# Patient Record
Sex: Male | Born: 1975 | Race: White | Hispanic: No | Marital: Married | Smoking: Never smoker
Health system: Southern US, Community
[De-identification: ages and names within clinical notes are randomized; demographics above are authoritative.]

---

## 2010-04-10 ENCOUNTER — Encounter (INDEPENDENT_AMBULATORY_CARE_PROVIDER_SITE_OTHER): Payer: Self-pay | Admitting: *Deleted

## 2010-04-10 LAB — CONVERTED CEMR LAB: Testosterone: 438.09 ng/dL (ref 250–890)

## 2010-09-04 ENCOUNTER — Encounter: Payer: Self-pay | Admitting: Cardiovascular Disease

## 2010-09-04 ENCOUNTER — Ambulatory Visit (INDEPENDENT_AMBULATORY_CARE_PROVIDER_SITE_OTHER): Payer: Self-pay | Admitting: Cardiovascular Disease

## 2010-09-04 DIAGNOSIS — I499 Cardiac arrhythmia, unspecified: Secondary | ICD-10-CM

## 2010-09-04 DIAGNOSIS — R079 Chest pain, unspecified: Secondary | ICD-10-CM | POA: Insufficient documentation

## 2010-09-04 NOTE — Assessment & Plan Note (Signed)
Non cardiac chest pain.  Does not need ETT.  F/U [facility] and consider MRI of left shoulder

## 2010-09-04 NOTE — Progress Notes (Signed)
35 yo from [place] referred by [facility] for SSCP.  Pain totally atypical over last few months.  Involves left shoulder mostly.  Pain is positional and sharp with migration over shoulder.  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  No significant CRF;s.  No associated dyspnea, palpitations, edema or syncope.  Pain clearly made worse with left arm movement.  ECG is normal  Reassured patient that pain was not cadiac.  Can F/U with [facility] and does not need stress testing at this time  ROS: Denies fever, malais, weight loss, blurry vision, decreased visual acuity, cough, sputum, SOB, hemoptysis, pleuritic pain, palpitaitons, heartburn, abdominal pain, melena, lower extremity edema, claudication, or rash.  All other systems reviewed and negative   General: Affect appropriate Healthy:  appears stated age HEENT: normal Neck supple with no adenopathy JVP normal no bruits no thyromegaly Lungs clear with no wheezing and good diaphragmatic motion Heart:  S1/S2 no murmur,rub, gallop or click PMI normal Abdomen: benighn, BS positve, no tenderness, no AAA no bruit.  No HSM or HJR Distal pulses intact with no bruits No edema Neuro non-focal Skin warm and dry No muscular weakness  Medications No current outpatient prescriptions on file.    Allergies Review of patient's allergies indicates no known allergies.  Family History: No family history on file.  Social History: History   Social History  . Marital Status: Single    Spouse Name: N/A    Number of Children: N/A  . Years of Education: N/A   Occupational History  . Not on file.   Social History Main Topics  . Smoking status: Never Smoker   . Smokeless tobacco: Never Used  . Alcohol Use: No  . Drug Use: No  . Sexually Active: Not on file   Other Topics Concern  . Not on file   Social History Narrative  . No narrative on file    Electrocardiogram:  NSR 76 normal ECG  Assessment and Plan

## 2011-02-17 ENCOUNTER — Other Ambulatory Visit: Payer: Self-pay | Admitting: Family Medicine

## 2011-02-17 ENCOUNTER — Ambulatory Visit (HOSPITAL_COMMUNITY)
Admission: RE | Admit: 2011-02-17 | Discharge: 2011-02-17 | Disposition: A | Discharge status: Home / Self Care | Payer: Self-pay | Source: Ambulatory Visit | Attending: Family Medicine | Admitting: Family Medicine

## 2011-02-17 DIAGNOSIS — M25512 Pain in left shoulder: Secondary | ICD-10-CM

## 2011-02-17 DIAGNOSIS — M25519 Pain in unspecified shoulder: Secondary | ICD-10-CM | POA: Insufficient documentation

## 2011-03-02 ENCOUNTER — Ambulatory Visit: Payer: Self-pay | Admitting: Physical Therapy

## 2011-03-04 ENCOUNTER — Ambulatory Visit: Payer: Self-pay | Attending: Family Medicine | Admitting: Physical Therapy

## 2011-03-04 DIAGNOSIS — M542 Cervicalgia: Secondary | ICD-10-CM | POA: Insufficient documentation

## 2011-03-04 DIAGNOSIS — M546 Pain in thoracic spine: Secondary | ICD-10-CM | POA: Insufficient documentation

## 2011-03-04 DIAGNOSIS — IMO0001 Reserved for inherently not codable concepts without codable children: Secondary | ICD-10-CM | POA: Insufficient documentation

## 2011-03-04 DIAGNOSIS — M2569 Stiffness of other specified joint, not elsewhere classified: Secondary | ICD-10-CM | POA: Insufficient documentation

## 2011-03-10 ENCOUNTER — Ambulatory Visit: Payer: Self-pay | Admitting: Physical Therapy

## 2011-03-13 ENCOUNTER — Ambulatory Visit: Payer: Self-pay | Admitting: Physical Therapy

## 2011-03-20 ENCOUNTER — Ambulatory Visit: Payer: Self-pay | Admitting: Physical Therapy

## 2012-11-11 ENCOUNTER — Ambulatory Visit (INDEPENDENT_AMBULATORY_CARE_PROVIDER_SITE_OTHER): Payer: BC Managed Care – PPO | Admitting: Family Medicine

## 2012-11-11 VITALS — BP 122/74 | HR 85 | Temp 98.0°F | Resp 17 | Ht 64.5 in | Wt 142.0 lb

## 2012-11-11 DIAGNOSIS — B356 Tinea cruris: Secondary | ICD-10-CM

## 2012-11-11 DIAGNOSIS — B353 Tinea pedis: Secondary | ICD-10-CM

## 2012-11-11 DIAGNOSIS — B354 Tinea corporis: Secondary | ICD-10-CM

## 2012-11-11 LAB — POCT CBC
Granulocyte percent: 47.5 %G (ref 37–80)
HCT, POC: 47.1 % (ref 43.5–53.7)
Hemoglobin: 15.6 g/dL (ref 14.1–18.1)
MCV: 93.4 fL (ref 80–97)
POC Granulocyte: 2.8 (ref 2–6.9)
POC LYMPH PERCENT: 45.6 %L (ref 10–50)
RBC: 5.04 M/uL (ref 4.69–6.13)
RDW, POC: 13.6 %

## 2012-11-11 MED ORDER — TERBINAFINE HCL 250 MG PO TABS: 250.0000 mg | ORAL_TABLET | Freq: Every day | ORAL | Status: DC

## 2012-11-11 NOTE — Progress Notes (Signed)
[facility] [address], [place] [address] [phone]  Date:  11/11/2012   Name:  Steven Campos   DOB:  June 15, 1975   MRN:  [id number]  PCP:  No PCP Per Patient    Chief Complaint: Neck Pain, Sore Throat and Groin Pain   History of Present Illness:  Steven Campos is a 37 y.o. very pleasant male patient who presents with the following:  Here today as a new patient.  Seen with non- cardiac CP by DR. Nishan in the past and cleared.  He is from the [place]  He states that "I have many things, I am here to be checked because I feel some pain."  Over the last 8 months he has had about 3 episodes when he woke up with his arm asleep and tingling.  This resolves when he moves the limb and shakes it.   He also notes something in his neck- it seems to make him sweat and sometimes feels itchy.  He notes a mild rash He also notes itching and hyperpigmentation in his groin area.  He also has a scaly rash on both feet.    He is generally healhty, no chronic health problems per his knowledge Patient Active Problem List   Diagnosis Date Noted  . Chest pain 09/04/2010    Past Medical History  Diagnosis Date  . Chest pain     with left shoulder pain    History reviewed. No pertinent past surgical history.  History  Substance Use Topics  . Smoking status: Never Smoker   . Smokeless tobacco: Never Used  . Alcohol Use: No    History reviewed. No pertinent family history.  No Known Allergies  Medication list has been reviewed and updated.  No current outpatient prescriptions on file prior to visit.   No current facility-administered medications on file prior to visit.    Review of Systems:  As per HPI- otherwise negative.   Physical Examination: Filed Vitals:   11/11/12 1230  BP: 122/74  Pulse: 85  Temp: 98 F (36.7 C)  Resp: 17   Filed Vitals:   11/11/12 1230  Height: 5' 4.5" (1.638 m)  Weight: 142 lb (64.411 kg)   Body mass index is  24.01 kg/(m^2). Ideal Body Weight: Weight in (lb) to have BMI = 25: 147.6  GEN: WDWN, NAD, Non-toxic, A & O x 3 HEENT: Atraumatic, Normocephalic. Neck supple. No masses, No LAD.  Bilateral TM wnl, oropharynx normal.  PEERL,EOMI.   Ears and Nose: No external deformity. CV: RRR, No M/G/R. No JVD. No thrill. No extra heart sounds. PULM: CTA B, no wheezes, crackles, rhonchi. No retractions. No resp. distress. No accessory muscle use. ABD: S, NT, ND, +BS. No rebound. No HSM. EXTR: No c/c/e NEURO Normal gait. Normal ROM, strength, sensation and DTR of both UE PSYCH: Normally interactive. Conversant. Not depressed or anxious appearing.  Calm demeanor.  Tinea corporis on his back, tinea pedis bilaterally, tinea cruris   Results for orders placed in visit on 11/11/12  POCT CBC      Result Value Range   WBC 5.8  4.6 - 10.2 K/uL   Lymph, poc 2.6  0.6 - 3.4   POC LYMPH PERCENT 45.6  10 - 50 %L   MID (cbc) 0.4  0 - 0.9   POC MID % 6.9  0 - 12 %M   POC Granulocyte 2.8  2 - 6.9   Granulocyte percent 47.5  37 - 80 %  G   RBC 5.04  4.69 - 6.13 M/uL   Hemoglobin 15.6  14.1 - 18.1 g/dL   HCT, POC 16.1  09.6 - 53.7 %   MCV 93.4  80 - 97 fL   MCH, POC 31.0  27 - 31.2 pg   MCHC 33.1  31.8 - 35.4 g/dL   RDW, POC 04.5     Platelet Count, POC 203  142 - 424 K/uL   MPV 9.3  0 - 99.8 fL    Assessment and Plan: Tinea corporis - Plan: terbinafine (LAMISIL) 250 MG tablet, POCT CBC, Comprehensive metabolic panel  Tinea pedis - Plan: terbinafine (LAMISIL) 250 MG tablet  Tinea cruris - Plan: terbinafine (LAMISIL) 250 MG tablet  Widespread tinea rash.  Treat with oral lamisil for 2 weeks.  Reassured that it sounds like he has slept on his arm a few times and it went to sleep.  This is not dangerous.  Await labs   Signed Abbe Amsterdam, MD

## 2012-11-11 NOTE — Patient Instructions (Signed)
Use the medication for your rash/ itching.  One pill a day for 2 weeks.  I will be in touch with your labs when they come in.

## 2012-11-12 ENCOUNTER — Encounter: Payer: Self-pay | Admitting: Family Medicine

## 2012-11-12 LAB — COMPREHENSIVE METABOLIC PANEL
Albumin: 4.5 g/dL (ref 3.5–5.2)
Alkaline Phosphatase: 52 U/L (ref 39–117)
BUN: 12 mg/dL (ref 6–23)
CO2: 27 mEq/L (ref 19–32)
Calcium: 9.5 mg/dL (ref 8.4–10.5)
Chloride: 104 mEq/L (ref 96–112)
Glucose, Bld: 104 mg/dL — ABNORMAL HIGH (ref 70–99)
Potassium: 4.1 mEq/L (ref 3.5–5.3)
Sodium: 139 mEq/L (ref 135–145)
Total Protein: 7.3 g/dL (ref 6.0–8.3)

## 2013-06-29 ENCOUNTER — Ambulatory Visit (INDEPENDENT_AMBULATORY_CARE_PROVIDER_SITE_OTHER): Payer: BC Managed Care – PPO | Admitting: Family Medicine

## 2013-06-29 VITALS — BP 104/60 | HR 62 | Temp 97.8°F | Resp 16 | Ht 65.0 in | Wt 142.2 lb

## 2013-06-29 DIAGNOSIS — R109 Unspecified abdominal pain: Secondary | ICD-10-CM

## 2013-06-29 DIAGNOSIS — B356 Tinea cruris: Secondary | ICD-10-CM

## 2013-06-29 DIAGNOSIS — B354 Tinea corporis: Secondary | ICD-10-CM

## 2013-06-29 DIAGNOSIS — B353 Tinea pedis: Secondary | ICD-10-CM

## 2013-06-29 DIAGNOSIS — M25519 Pain in unspecified shoulder: Secondary | ICD-10-CM

## 2013-06-29 DIAGNOSIS — M25511 Pain in right shoulder: Secondary | ICD-10-CM

## 2013-06-29 MED ORDER — DICLOFENAC SODIUM 75 MG PO TBEC: 75.0000 mg | DELAYED_RELEASE_TABLET | Freq: Two times a day (BID) | ORAL | Status: DC

## 2013-06-29 MED ORDER — TERBINAFINE HCL 250 MG PO TABS: 250.0000 mg | ORAL_TABLET | Freq: Every day | ORAL | Status: DC

## 2013-06-29 NOTE — Patient Instructions (Signed)
Take the Lamisil (terbinafine) one daily for 2 weeks  Use some over-the-counter Lotrimin cream also twice daily on the rash in the groin and on the back of the neck  Take diclofenac one at breakfast and one at supper for 2 week for the pain and inflammation in the right shoulder and in both sides of the abdomen. I think that the abdominal pain is probably just in the muscles from the constant strain doing case picking.  The pain in the right shoulder sounds like you are pinching I nerve in the neck that is shooting the severe pain. The anti-inflammatory medication should help. If the pain keeps recurring we may have to do some special x-ray studies.  The place on your right leg probably will just go away on its own. If he gets further problems get rechecked.

## 2013-06-29 NOTE — Progress Notes (Signed)
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 He was here last summer with a rash on the back of his neck and in his groin area. He was treated with Lamisil. The rash in the groin when away completely. Neck did better for a little while then recurred. The rash has now come back in the groin area again. He also has been having a pain in the right shoulder area, just above the right collarbone. It feels deep. He can twist his neck to the left to get some relief. He if he gives deep massaging it helps a little bit. When it hits him it is very intense pain. It doesn't last for long but it is bad about hurts. He also has problems with both sides of the abdominal wall having pain. No problems with his bowels or urine. He is tender there. Lastly he has a little bump on his right thigh. He's been hurting there, and then he noticed this little bump.  Objective: Pleasant gentleman in no major distress. The skin of the neck is a little rough but no obvious rash. He has a obvious tinea cruris rash in the groin area. On the right side there is a little nonspecific 2 or 3 mm bump, almost looks like a little folliculitis that is getting better. The neck has fair range of motion. He is tender in the right supraclavicular area. No nodes. Good range of motion. Abdomen has normal bowel sounds. Soft without masses. He is tender for laterally in both lateral flank areas.  Assessment: Tinea cruris and probable tinea corporis Right shoulder pains, probably neuropathic from a nerve being pinched in the neck area. Nonspecific flank pain, probable muscle strain Nonspecific skin lesion right thigh  Plan: Treat with Lamisil one more time. In addition to that give him instructions to use some common cream Diclofenac one twice daily for 2 weeks for the supraclavicular and abdominal wall pain If the place on his thigh gets worse he is to return

## 2013-11-13 ENCOUNTER — Ambulatory Visit (INDEPENDENT_AMBULATORY_CARE_PROVIDER_SITE_OTHER): Payer: BC Managed Care – PPO | Admitting: Internal Medicine

## 2013-11-13 ENCOUNTER — Ambulatory Visit (INDEPENDENT_AMBULATORY_CARE_PROVIDER_SITE_OTHER): Payer: BC Managed Care – PPO

## 2013-11-13 VITALS — BP 120/66 | HR 61 | Temp 97.6°F | Resp 18 | Ht 64.0 in | Wt 139.0 lb

## 2013-11-13 DIAGNOSIS — R1032 Left lower quadrant pain: Secondary | ICD-10-CM

## 2013-11-13 DIAGNOSIS — R079 Chest pain, unspecified: Secondary | ICD-10-CM

## 2013-11-13 LAB — POCT URINALYSIS DIPSTICK
Bilirubin, UA: NEGATIVE
Blood, UA: NEGATIVE
Glucose, UA: NEGATIVE
Ketones, UA: NEGATIVE
Leukocytes, UA: NEGATIVE
Nitrite, UA: NEGATIVE
Protein, UA: NEGATIVE
Spec Grav, UA: 1.015
Urobilinogen, UA: 0.2
pH, UA: 7

## 2013-11-13 LAB — POCT CBC
Granulocyte percent: 39.6 % (ref 37–80)
HCT, POC: 49.8 % (ref 43.5–53.7)
Hemoglobin: 16.2 g/dL (ref 14.1–18.1)
Lymph, poc: 3 (ref 0.6–3.4)
MCH, POC: 29.6 pg (ref 27–31.2)
MCHC: 32.5 g/dL (ref 31.8–35.4)
MCV: 91 fL (ref 80–97)
MID (cbc): 0.4 (ref 0–0.9)
MPV: 7.7 fL (ref 0–99.8)
POC Granulocyte: 2.2 (ref 2–6.9)
POC LYMPH PERCENT: 52.8 % — AB (ref 10–50)
POC MID %: 7.6 % (ref 0–12)
Platelet Count, POC: 193 K/uL (ref 142–424)
RBC: 5.47 M/uL (ref 4.69–6.13)
RDW, POC: 13.1 %
WBC: 5.6 K/uL (ref 4.6–10.2)

## 2013-11-13 LAB — POCT UA - MICROSCOPIC ONLY
Bacteria, U Microscopic: NEGATIVE
Casts, Ur, LPF, POC: NEGATIVE
Crystals, Ur, HPF, POC: NEGATIVE
Mucus, UA: NEGATIVE
RBC, urine, microscopic: NEGATIVE
WBC, Ur, HPF, POC: NEGATIVE
Yeast, UA: NEGATIVE

## 2013-11-13 LAB — POCT SEDIMENTATION RATE: POCT SED RATE: 15 mm/h (ref 0–22)

## 2013-11-13 MED ORDER — POLYETHYLENE GLYCOL 3350 17 GM/SCOOP PO POWD: 17.0000 g | Freq: Every day | ORAL | Status: DC

## 2013-11-13 NOTE — Progress Notes (Signed)
Subjective:    Patient ID: Steven Campos, male    DOB: [birth year], 38 y.o.   MRN: [id number]  Flank Pain Pertinent negatives include no dysuria or fever.  Shoulder Pain  Pertinent negatives include no fever.   this 38 year old male has 2 complaints He has noticed right-sided chest pain on and off for several months This occurs while sitting still or while at work or even at night when he rolls over or tries to sit up  There is no associated shortness of breath, palpitations, syncope, change in activity level. This has not caused him to miss work or stop a current activity He also is complaining of abdominal pain now in the left lower quadrant He was evaluated here in April with pain in the abdomen and has no acute findings He has intermittent pain down the left lower quadrant, the right lower quadrant has resolved. He denies constipation diarrhea change in appetite weight loss fever or night sweats. He denies any urinary symptoms   He has no current illnesses and is on no medications   Review of Systems  Constitutional: Negative for fever, activity change, appetite change, fatigue and unexpected weight change.  HENT: Negative for trouble swallowing.   Respiratory: Negative for cough, chest tightness, shortness of breath and wheezing.   Cardiovascular: Negative for palpitations and leg swelling.  Gastrointestinal: Negative for nausea, vomiting, diarrhea, constipation and blood in stool.  Genitourinary: Negative for dysuria, frequency, flank pain and difficulty urinating.  Musculoskeletal: Negative for back pain.       Objective:   Physical Exam BP 120/66  Pulse 61  Temp(Src) 97.6 F (36.4 C) (Oral)  Resp 18  Ht 5\' 4"  (1.626 m)  Wt 139 lb (63.05 kg)  BMI 23.85 kg/m2  SpO2 98% No acute distress HEENT clear////neck full range of motion without pain Heart regular without murmur The chest is clear to auscultation There is no tenderness in the area of pain mentioned with  palpation of the clavicle, the anterior, the costosternal junctions, or the sternum Shoulder on the right has a full range of motion without pain with resisted and unresisted movements The abdomen is soft nontender nondistended with no masses or organomegaly Extremities have no edema   [facility] reading (PRIMARY) by  Dr. Merla Riches chest x-ray shows no acute findings or chronic problems. The abdomen has lots of gas trapped sparing the distal colon which is likely full stool  Results for orders placed in visit on 11/13/13  POCT CBC      Result Value Ref Range   WBC 5.6  4.6 - 10.2 K/uL   Lymph, poc 3.0  0.6 - 3.4   POC LYMPH PERCENT 52.8 (*) 10 - 50 %L   MID (cbc) 0.4  0 - 0.9   POC MID % 7.6  0 - 12 %M   POC Granulocyte 2.2  2 - 6.9   Granulocyte percent 39.6  37 - 80 %G   RBC 5.47  4.69 - 6.13 M/uL   Hemoglobin 16.2  14.1 - 18.1 g/dL   HCT, POC 40.9  81.1 - 53.7 %   MCV 91.0  80 - 97 fL   MCH, POC 29.6  27 - 31.2 pg   MCHC 32.5  31.8 - 35.4 g/dL   RDW, POC 91.4     Platelet Count, POC 193  142 - 424 K/uL   MPV 7.7  0 - 99.8 fL  POCT UA - MICROSCOPIC ONLY      Result Value  Ref Range   WBC, Ur, HPF, POC neg     RBC, urine, microscopic neg     Bacteria, U Microscopic neg     Mucus, UA neg     Epithelial cells, urine per micros 0-1     Crystals, Ur, HPF, POC neg     Casts, Ur, LPF, POC neg     Yeast, UA neg    POCT URINALYSIS DIPSTICK      Result Value Ref Range   Color, UA yellow     Clarity, UA clear     Glucose, UA neg     Bilirubin, UA neg     Ketones, UA neg     Spec Grav, UA 1.015     Blood, UA neg     pH, UA 7.0     Protein, UA neg     Urobilinogen, UA 0.2     Nitrite, UA neg     Leukocytes, UA Negative           Assessment & Plan:  Chest pain, unspecified - Plan: POCT CBC, DG Chest 2 View  Abdominal pain, LLQ - Plan: POCT CBC, POCT SEDIMENTATION RATE, POCT UA - Microscopic Only, POCT urinalysis dipstick, Comprehensive metabolic panel, DG Abd 1 View  Meds  ordered this encounter  Medications  . polyethylene glycol powder (GLYCOLAX/MIRALAX) powder    Sig: Take 17 g by mouth daily. For 10 days    Dispense:  255 g    Refill:  1   Reassured If meds do not work we'll do a barium enema or have GI evaluate

## 2013-11-13 NOTE — Patient Instructions (Signed)
Constipation  Constipation is when a person has fewer than three bowel movements a week, has difficulty having a bowel movement, or has stools that are dry, hard, or larger than normal. As people grow older, constipation is more common. If you try to fix constipation with medicines that make you have a bowel movement (laxatives), the problem may get worse. Long-term laxative use may cause the muscles of the colon to become weak. A low-fiber diet, not taking in enough fluids, and taking certain medicines may make constipation worse.   CAUSES   · Certain medicines, such as antidepressants, pain medicine, iron supplements, antacids, and water pills.    · Certain diseases, such as diabetes, irritable bowel syndrome (IBS), thyroid disease, or depression.    · Not drinking enough water.    · Not eating enough fiber-rich foods.    · Stress or travel.    · Lack of physical activity or exercise.    · Ignoring the urge to have a bowel movement.    · Using laxatives too much.    SIGNS AND SYMPTOMS   · Having fewer than three bowel movements a week.    · Straining to have a bowel movement.    · Having stools that are hard, dry, or larger than normal.    · Feeling full or bloated.    · Pain in the lower abdomen.    · Not feeling relief after having a bowel movement.    DIAGNOSIS   Your health care provider will take a medical history and perform a physical exam. Further testing may be done for severe constipation. Some tests may include:  · A barium enema X-ray to examine your rectum, colon, and, sometimes, your small intestine.    · A sigmoidoscopy to examine your lower colon.    · A colonoscopy to examine your entire colon.  TREATMENT   Treatment will depend on the severity of your constipation and what is causing it. Some dietary treatments include drinking more fluids and eating more fiber-rich foods. Lifestyle treatments may include regular exercise. If these diet and lifestyle recommendations do not help, your health care  provider may recommend taking over-the-counter laxative medicines to help you have bowel movements. Prescription medicines may be prescribed if over-the-counter medicines do not work.   HOME CARE INSTRUCTIONS   · Eat foods that have a lot of fiber, such as fruits, vegetables, whole grains, and beans.  · Limit foods high in fat and processed sugars, such as french fries, hamburgers, cookies, candies, and soda.    · A fiber supplement may be added to your diet if you cannot get enough fiber from foods.    · Drink enough fluids to keep your urine clear or pale yellow.    · Exercise regularly or as directed by your health care provider.    · Go to the restroom when you have the urge to go. Do not hold it.    · Only take over-the-counter or prescription medicines as directed by your health care provider. Do not take other medicines for constipation without talking to your health care provider first.    SEEK IMMEDIATE MEDICAL CARE IF:   · You have bright red blood in your stool.    · Your constipation lasts for more than 4 days or gets worse.    · You have abdominal or rectal pain.    · You have thin, pencil-like stools.    · You have unexplained weight loss.  MAKE SURE YOU:   · Understand these instructions.  · Will watch your condition.  · Will get help right away if you are not   you have with your health care provider. Costochondritis Costochondritis, sometimes called Tietze syndrome, is a swelling and irritation (inflammation) of the tissue (cartilage) that connects your ribs with your breastbone (sternum). It causes pain in the chest and rib area. Costochondritis usually goes away on its own over time. It can take up to 6  weeks or longer to get better, especially if you are unable to limit your activities. CAUSES  Some cases of costochondritis have no known cause. Possible causes include:  Injury (trauma).  Exercise or activity such as lifting.  Severe coughing. SIGNS AND SYMPTOMS  Pain and tenderness in the chest and rib area.  Pain that gets worse when coughing or taking deep breaths.  Pain that gets worse with specific movements. DIAGNOSIS  Your health care provider will do a physical exam and ask about your symptoms. Chest X-rays or other tests may be done to rule out other problems. TREATMENT  Costochondritis usually goes away on its own over time. Your health care provider may prescribe medicine to help relieve pain. HOME CARE INSTRUCTIONS   Avoid exhausting physical activity. Try not to strain your ribs during normal activity. This would include any activities using chest, abdominal, and side muscles, especially if heavy weights are used.  Apply ice to the affected area for the first 2 days after the pain begins.  Put ice in a plastic bag.  Place a towel between your skin and the bag.  Leave the ice on for 20 minutes, 2-3 times a day.  Only take over-the-counter or prescription medicines as directed by your health care provider. SEEK MEDICAL CARE IF:  You have redness or swelling at the rib joints. These are signs of infection.  Your pain does not go away despite rest or medicine. SEEK IMMEDIATE MEDICAL CARE IF:   Your pain increases or you are very uncomfortable.  You have shortness of breath or difficulty breathing.  You cough up blood.  You have worse chest pains, sweating, or vomiting.  You have a fever or persistent symptoms for more than 2-3 days.  You have a fever and your symptoms suddenly get worse. MAKE SURE YOU:   Understand these instructions.  Will watch your condition.  Will get help right away if you are not doing well or get worse. Document Released:  12/24/2004 Document Revised: 01/04/2013 Document Reviewed: 10/18/2012 [facility]ExitCare Patient Information 2015 [employer] [place]. This information is not intended to replace advice given to you by your health care provider. Make sure you discuss any questions you have with your health care provider.

## 2013-11-14 LAB — COMPREHENSIVE METABOLIC PANEL
ALBUMIN: 5 g/dL (ref 3.5–5.2)
ALT: 19 U/L (ref 0–53)
AST: 17 U/L (ref 0–37)
Alkaline Phosphatase: 58 U/L (ref 39–117)
BILIRUBIN TOTAL: 0.7 mg/dL (ref 0.2–1.2)
BUN: 7 mg/dL (ref 6–23)
CO2: 27 mEq/L (ref 19–32)
Calcium: 10.2 mg/dL (ref 8.4–10.5)
Chloride: 102 mEq/L (ref 96–112)
Creat: 0.67 mg/dL (ref 0.50–1.35)
GLUCOSE: 94 mg/dL (ref 70–99)
Potassium: 4.5 mEq/L (ref 3.5–5.3)
Sodium: 139 mEq/L (ref 135–145)
Total Protein: 7.6 g/dL (ref 6.0–8.3)

## 2013-11-17 ENCOUNTER — Encounter: Payer: Self-pay | Admitting: Internal Medicine

## 2013-12-19 ENCOUNTER — Ambulatory Visit (INDEPENDENT_AMBULATORY_CARE_PROVIDER_SITE_OTHER): Payer: BC Managed Care – PPO | Admitting: Internal Medicine

## 2013-12-19 ENCOUNTER — Ambulatory Visit (INDEPENDENT_AMBULATORY_CARE_PROVIDER_SITE_OTHER): Payer: BC Managed Care – PPO

## 2013-12-19 VITALS — BP 112/60 | HR 74 | Temp 98.1°F | Resp 16 | Ht 64.25 in | Wt 143.4 lb

## 2013-12-19 DIAGNOSIS — Z789 Other specified health status: Secondary | ICD-10-CM

## 2013-12-19 DIAGNOSIS — M545 Low back pain, unspecified: Secondary | ICD-10-CM

## 2013-12-19 DIAGNOSIS — R072 Precordial pain: Secondary | ICD-10-CM

## 2013-12-19 DIAGNOSIS — R0789 Other chest pain: Secondary | ICD-10-CM

## 2013-12-19 DIAGNOSIS — S20219A Contusion of unspecified front wall of thorax, initial encounter: Secondary | ICD-10-CM

## 2013-12-19 MED ORDER — METHOCARBAMOL 750 MG PO TABS: 750.0000 mg | ORAL_TABLET | Freq: Four times a day (QID) | ORAL | Status: DC

## 2013-12-19 MED ORDER — IBUPROFEN 600 MG PO TABS: 600.0000 mg | ORAL_TABLET | Freq: Three times a day (TID) | ORAL | Status: DC | PRN
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
## 2013-12-19 NOTE — Patient Instructions (Signed)

## 2014-06-14 ENCOUNTER — Ambulatory Visit (INDEPENDENT_AMBULATORY_CARE_PROVIDER_SITE_OTHER): Payer: 59 | Admitting: Family Medicine

## 2014-06-14 ENCOUNTER — Ambulatory Visit (INDEPENDENT_AMBULATORY_CARE_PROVIDER_SITE_OTHER): Payer: 59

## 2014-06-14 VITALS — BP 98/64 | HR 69 | Temp 98.3°F | Resp 16 | Ht 65.0 in | Wt 141.0 lb

## 2014-06-14 DIAGNOSIS — M609 Myositis, unspecified: Secondary | ICD-10-CM | POA: Diagnosis not present

## 2014-06-14 DIAGNOSIS — M25552 Pain in left hip: Secondary | ICD-10-CM | POA: Diagnosis not present

## 2014-06-14 DIAGNOSIS — M791 Myalgia: Secondary | ICD-10-CM

## 2014-06-14 DIAGNOSIS — E860 Dehydration: Secondary | ICD-10-CM | POA: Diagnosis not present

## 2014-06-14 DIAGNOSIS — B349 Viral infection, unspecified: Secondary | ICD-10-CM | POA: Diagnosis not present

## 2014-06-14 DIAGNOSIS — R55 Syncope and collapse: Secondary | ICD-10-CM | POA: Diagnosis not present

## 2014-06-14 DIAGNOSIS — IMO0001 Reserved for inherently not codable concepts without codable children: Secondary | ICD-10-CM

## 2014-06-14 DIAGNOSIS — R509 Fever, unspecified: Secondary | ICD-10-CM

## 2014-06-14 DIAGNOSIS — J111 Influenza due to unidentified influenza virus with other respiratory manifestations: Secondary | ICD-10-CM | POA: Diagnosis not present

## 2014-06-14 LAB — POCT CBC
Granulocyte percent: 57.2 %G (ref 37–80)
HEMATOCRIT: 48.2 % (ref 43.5–53.7)
Hemoglobin: 15.4 g/dL (ref 14.1–18.1)
LYMPH, POC: 1.9 (ref 0.6–3.4)
MCH, POC: 29.2 pg (ref 27–31.2)
MCHC: 32 g/dL (ref 31.8–35.4)
MCV: 91.2 fL (ref 80–97)
MID (cbc): 0.4 (ref 0–0.9)
MPV: 8.2 fL (ref 0–99.8)
POC Granulocyte: 3.1 (ref 2–6.9)
POC LYMPH %: 35.2 % (ref 10–50)
POC MID %: 7.6 % (ref 0–12)
Platelet Count, POC: 147 10*3/uL (ref 142–424)
RBC: 5.29 M/uL (ref 4.69–6.13)
RDW, POC: 13 %
WBC: 5.5 10*3/uL (ref 4.6–10.2)

## 2014-06-14 LAB — COMPREHENSIVE METABOLIC PANEL
ALBUMIN: 4.7 g/dL (ref 3.5–5.2)
ALK PHOS: 53 U/L (ref 39–117)
ALT: 23 U/L (ref 0–53)
AST: 22 U/L (ref 0–37)
BILIRUBIN TOTAL: 0.8 mg/dL (ref 0.2–1.2)
BUN: 9 mg/dL (ref 6–23)
CO2: 24 mEq/L (ref 19–32)
CREATININE: 0.78 mg/dL (ref 0.50–1.35)
Calcium: 9.6 mg/dL (ref 8.4–10.5)
Chloride: 99 mEq/L (ref 96–112)
GLUCOSE: 80 mg/dL (ref 70–99)
Potassium: 4.4 mEq/L (ref 3.5–5.3)
Sodium: 137 mEq/L (ref 135–145)
TOTAL PROTEIN: 7.4 g/dL (ref 6.0–8.3)

## 2014-06-14 LAB — POCT URINALYSIS DIPSTICK
BILIRUBIN UA: NEGATIVE
Blood, UA: NEGATIVE
Glucose, UA: NEGATIVE
Ketones, UA: NEGATIVE
Leukocytes, UA: NEGATIVE
NITRITE UA: NEGATIVE
Protein, UA: NEGATIVE
Urobilinogen, UA: 0.2
pH, UA: 6

## 2014-06-14 LAB — POCT UA - MICROSCOPIC ONLY
BACTERIA, U MICROSCOPIC: NEGATIVE
Casts, Ur, LPF, POC: NEGATIVE
Crystals, Ur, HPF, POC: NEGATIVE
Mucus, UA: NEGATIVE
RBC, URINE, MICROSCOPIC: NEGATIVE
YEAST UA: NEGATIVE

## 2014-06-14 LAB — CK: Total CK: 166 U/L (ref 7–232)

## 2014-06-14 LAB — POCT SEDIMENTATION RATE: POCT SED RATE: 25 mm/h — AB (ref 0–22)

## 2014-06-14 MED ORDER — BENZONATATE 100 MG PO CAPS: 100.0000 mg | ORAL_CAPSULE | Freq: Three times a day (TID) | ORAL | Status: DC | PRN

## 2014-06-14 MED ORDER — PROMETHAZINE HCL 25 MG PO TABS: 25.0000 mg | ORAL_TABLET | Freq: Three times a day (TID) | ORAL | Status: DC | PRN

## 2014-06-14 MED ORDER — SIMETHICONE 80 MG PO CHEW: 80.0000 mg | CHEWABLE_TABLET | Freq: Four times a day (QID) | ORAL | Status: DC | PRN

## 2014-06-14 MED ORDER — HYDROCOD POLST-CHLORPHEN POLST 10-8 MG/5ML PO LQCR: 5.0000 mL | Freq: Two times a day (BID) | ORAL | Status: DC | PRN

## 2014-06-14 MED ORDER — OMEPRAZOLE 40 MG PO CPDR: 40.0000 mg | DELAYED_RELEASE_CAPSULE | Freq: Every day | ORAL | Status: DC

## 2014-06-14 MED ORDER — ONDANSETRON 4 MG PO TBDP
4.0000 mg | ORAL_TABLET | Freq: Once | ORAL | Status: AC
  Administered 2014-06-14: 4 mg via ORAL

## 2014-06-14 NOTE — Progress Notes (Addendum)
Subjective:  This chart was scribed for Norberto SorensonEva Shaw, MD by Elveria Risingimelie Horne, Medial Scribe. This patient was seen in room 12 and the patient's care was started at 12:41 PM.    Patient ID: Steven Campos, male    DOB: Aug 14, 1975, [age] y.o.   MRN: [id number] Chief Complaint  Patient presents with  . Abdominal Pain    Radiates from left side to right  . Cough    Started Tuesday  . Fever    Pt states he had fever last night. Temp Unspecified  . Chest Pain    When Coughing    HPI HPI Comments: Steven Campos is a 39 y.o. male who presents to the [facility] with multiple complaints. Patient reports bilateral inguinal pain radiation from left to right, onset two weeks. Two days ago patient reports development of productive cough and with worsening. Patient reports severe cough that causes him chest pain, bilateral rib pain and weakness stating he feel that he going to pass out. Patient reports associated generalized myalgias and arthralgias. Patient reports that his cough initiated as productive but is now dry. Patient reports subjective fever last night treated with OTC medications and honey which did mildly relief his pain. Patient denies shortness or breath, pain with deep breathing, urinary symptoms, or changes in bowel/bladder habits. Patient reports decreased appetite, but states that he is forcing food and keeping hydrated. Patient denies history of asthma; patient is not a smoker. Patient reports taking ibuprofen and DayQuil.  Patient shares sick contacts at home; his daughter.   Patient Active Problem List   Diagnosis Date Noted  . Chest pain 09/04/2010   Past Medical History  Diagnosis Date  . Chest pain     with left shoulder pain   History reviewed. No pertinent past surgical history. No Known Allergies Prior to Admission medications   Medication Sig Start Date End Date Taking? Authorizing Provider  ibuprofen (ADVIL,MOTRIN) 600 MG tablet Take 1 tablet (600 mg  total) by mouth every 8 (eight) hours as needed. 12/19/13  Yes Jonita Albeehris W Guest, MD  diclofenac (VOLTAREN) 75 MG EC tablet Take 1 tablet (75 mg total) by mouth 2 (two) times daily. Patient not taking: Reported on 06/14/2014 06/29/13   Peyton Najjaravid H Hopper, MD  methocarbamol (ROBAXIN-750) 750 MG tablet Take 1 tablet (750 mg total) by mouth 4 (four) times daily. Patient not taking: Reported on 06/14/2014 12/19/13   Jonita Albeehris W Guest, MD  polyethylene glycol powder [facility](GLYCOLAX/MIRALAX) powder Take 17 g by mouth daily. For 10 days Patient not taking: Reported on 06/14/2014 11/13/13   Tonye Pearsonobert P Doolittle, MD  terbinafine (LAMISIL) 250 MG tablet Take 1 tablet (250 mg total) by mouth daily. Patient not taking: Reported on 06/14/2014 06/29/13   Peyton Najjaravid H Hopper, MD   History   Social History  . Marital Status: Married    Spouse Name: N/A  . Number of Children: N/A  . Years of Education: N/A   Occupational History  . Not on file.   Social History Main Topics  . Smoking status: Never Smoker   . Smokeless tobacco: Never Used  . Alcohol Use: No  . Drug Use: No  . Sexual Activity: No   Other Topics Concern  . Not on file   Social History Narrative      Review of Systems  Constitutional: Negative for fever.  Respiratory: Positive for cough.   Cardiovascular: Positive for chest pain. Negative for palpitations.  Gastrointestinal: Positive for nausea and abdominal  pain. Negative for vomiting, diarrhea, constipation and blood in stool.  Genitourinary: Negative for dysuria, frequency and hematuria.  Musculoskeletal: Positive for myalgias and arthralgias.       Objective:   Physical Exam  Constitutional: He is oriented to person, place, and time. He appears well-developed and well-nourished. No distress.  HENT:  Head: Normocephalic and atraumatic.  Right Ear: Tympanic membrane normal.  Left Ear: Tympanic membrane normal.  Mouth/Throat: Oropharynx is clear and moist.  Eyes: EOM are normal.  Neck: Neck supple.  No tracheal deviation present. No thyroid mass and no thyromegaly present.  Cardiovascular: Normal rate, regular rhythm and normal heart sounds.   No murmur heard. Pulmonary/Chest: Effort normal and breath sounds normal. No respiratory distress. He exhibits no tenderness.  No sternal tenderness to palpation.  Abdominal: Soft. He exhibits no pulsatile midline mass. There is no hepatosplenomegaly. There is no CVA tenderness.  Hyperactive tympanic bowel sounds. Mildy distended, diffusely tenderness.   Genitourinary:  Tender to palpation of ASIS, left worse than right. No inguinal pain or adenopathy bilaterally.   Musculoskeletal: Normal range of motion.  2+ pedal pulses biterally.   Lymphadenopathy:    He has no cervical adenopathy.  Neurological: He is alert and oriented to person, place, and time.  Skin: Skin is warm and dry.  Psychiatric: He has a normal mood and affect. His behavior is normal.  Nursing note and vitals reviewed.    Filed Vitals:   06/14/14 1204  BP: 96/62  Pulse: 78  Temp: 98.3 F (36.8 C)  TempSrc: Oral  Resp: 16  Height:  (1.651 m)  Weight: 141 lb (63.957 kg)  SpO2: 97%   EKG: NSR, no ischemic changes  [facility] reading (PRIMARY) by  Dr. Clelia Croft: Pelvis xray: due to point tenderness over L>Rt ASIS, iliac crest and iliac spine appear normal. No bony pelvic abnormality. No concern for apophysitis. Acute abdomen series:    Lateral chest requested was not obtained. Patient has increased linear streaking in right lower lobe. But otherwise normal. abdominal films show increased bowel gas but in a nonspeciifc nonconcerning fashion. No free air.    Dg Pelvis 1-2 Views  06/14/2014   CLINICAL DATA:  Point tenderness over the anterior superior iliac spines greater on the left than right  EXAM: PELVIS - 1-2 VIEW  COMPARISON:  Abdominal series of November 13, 2013  FINDINGS: The bony pelvis is adequately mineralized. There is no lytic or blastic lesion. There is no fracture  nor dislocation. There is no periosteal reaction. The hip joint spaces are preserved. The sacrum and SI joints are unremarkable. The soft tissues are normal.  IMPRESSION: There is no acute bony abnormality of the pelvis.   Electronically Signed   By: David  [place]   On: 06/14/2014 16:25   Dg Abd Acute W/chest  06/14/2014   CLINICAL DATA:  Pelvic pain, thigh pain, joint pain, bilateral inguinal pain  EXAM: ACUTE ABDOMEN SERIES (ABDOMEN 2 VIEW & CHEST 1 VIEW)  COMPARISON:  None.  FINDINGS: There is no evidence of dilated bowel loops or free intraperitoneal air. No radiopaque calculi or other significant radiographic abnormality is seen. Heart size and mediastinal contours are within normal limits. Both lungs are clear.  IMPRESSION: Negative abdominal radiographs.  No acute cardiopulmonary disease.   Electronically Signed   By: Elige Ko   On: 06/14/2014 16:33       Assessment & Plan:   Pain in joint, pelvic region and thigh, left - Plan: POCT UA -  Microscopic Only, POCT urinalysis dipstick, POCT SEDIMENTATION RATE, POCT CBC, CK, Comprehensive metabolic panel, Urine culture, DG Abd Acute W/Chest, DG Pelvis 1-2 Views, EKG 12-Lead  Pre-syncope  Myalgia and myositis  Fever, unspecified fever cause  Influenza  Dehydration  Acute viral syndrome  Meds ordered this encounter  Medications  . chlorpheniramine-HYDROcodone (TUSSIONEX PENNKINETIC ER) 10-8 MG/5ML LQCR    Sig: Take 5 mLs by mouth every 12 (twelve) hours as needed.    Dispense:  120 mL    Refill:  0  . benzonatate (TESSALON) 100 MG capsule    Sig: Take 1-2 capsules (100-200 mg total) by mouth 3 (three) times daily as needed for cough.    Dispense:  60 capsule    Refill:  0  . simethicone (GAS-X) 80 MG chewable tablet    Sig: Chew 1 tablet (80 mg total) by mouth every 6 (six) hours as needed for flatulence.    Dispense:  30 tablet    Refill:  0  . omeprazole (PRILOSEC) 40 MG capsule    Sig: Take 1 capsule (40 mg total) by mouth  daily.    Dispense:  30 capsule    Refill:  1  . promethazine (PHENERGAN) 25 MG tablet    Sig: Take 1 tablet (25 mg total) by mouth every 8 (eight) hours as needed for nausea or vomiting.    Dispense:  20 tablet    Refill:  0    I personally performed the services described in this documentation, which was scribed in my presence. The recorded information has been reviewed and considered, and addended by me as needed.  Norberto Sorenson, MD MPH

## 2014-06-14 NOTE — Patient Instructions (Addendum)
Dehydration, Adult Dehydration is when you lose more fluids from the body than you take in. Vital organs like the kidneys, brain, and heart cannot function without a proper amount of fluids and salt. Any loss of fluids from the body can cause dehydration.  CAUSES   Vomiting.  Diarrhea.  Excessive sweating.  Excessive urine output.  Fever. SYMPTOMS  Mild dehydration  Thirst.  Dry lips.  Slightly dry mouth. Moderate dehydration  Very dry mouth.  Sunken eyes.  Skin does not bounce back quickly when lightly pinched and released.  Dark urine and decreased urine production.  Decreased tear production.  Headache. Severe dehydration  Very dry mouth.  Extreme thirst.  Rapid, weak pulse (more than 100 beats per minute at rest).  Cold hands and feet.  Not able to sweat in spite of heat and temperature.  Rapid breathing.  Blue lips.  Confusion and lethargy.  Difficulty being awakened.  Minimal urine production.  No tears. DIAGNOSIS  Your caregiver will diagnose dehydration based on your symptoms and your exam. Blood and urine tests will help confirm the diagnosis. The diagnostic evaluation should also identify the cause of dehydration. TREATMENT  Treatment of mild or moderate dehydration can often be done at home by increasing the amount of fluids that you drink. It is best to drink small amounts of fluid more often. Drinking too much at one time can make vomiting worse. Refer to the home care instructions below. Severe dehydration needs to be treated at the hospital where you will probably be given intravenous (IV) fluids that contain water and electrolytes. HOME CARE INSTRUCTIONS   Ask your caregiver about specific rehydration instructions.  Drink enough fluids to keep your urine clear or pale yellow.  Drink small amounts frequently if you have nausea and vomiting.  Eat as you normally do.  Avoid:  Foods or drinks high in sugar.  Carbonated  drinks.  Juice.  Extremely hot or cold fluids.  Drinks with caffeine.  Fatty, greasy foods.  Alcohol.  Tobacco.  Overeating.  Gelatin desserts.  Wash your hands well to avoid spreading bacteria and viruses.  Only take over-the-counter or prescription medicines for pain, discomfort, or fever as directed by your caregiver.  Ask your caregiver if you should continue all prescribed and over-the-counter medicines.  Keep all follow-up appointments with your caregiver. SEEK MEDICAL CARE IF:  You have abdominal pain and it increases or stays in one area (localizes).  You have a rash, stiff neck, or severe headache.  You are irritable, sleepy, or difficult to awaken.  You are weak, dizzy, or extremely thirsty. SEEK IMMEDIATE MEDICAL CARE IF:   You are unable to keep fluids down or you get worse despite treatment.  You have frequent episodes of vomiting or diarrhea.  You have blood or green matter (bile) in your vomit.  You have blood in your stool or your stool looks black and tarry.  You have not urinated in 6 to 8 hours, or you have only urinated a small amount of very dark urine.  You have a fever.  You faint. MAKE SURE YOU:   Understand these instructions.  Will watch your condition.  Will get help right away if you are not doing well or get worse. Document Released: 03/16/2005 Document Revised: 06/08/2011 Document Reviewed: 11/03/2010 [employer] Patient Information 2015 [employer]. This information is not intended to replace advice given to you by your health care provider. Make sure you discuss any questions you have with your health care   provider.   Viral Infections A viral infection can be caused by different types of viruses.Most viral infections are not serious and resolve on their own. However, some infections may cause severe symptoms and may lead to further complications. SYMPTOMS Viruses can frequently cause:  Minor sore throat.  Aches and  pains.  Headaches.  Runny nose.  Different types of rashes.  Watery eyes.  Tiredness.  Cough.  Loss of appetite.  Gastrointestinal infections, resulting in nausea, vomiting, and diarrhea. These symptoms do not respond to antibiotics because the infection is not caused by bacteria. However, you might catch a bacterial infection following the viral infection. This is sometimes called a "superinfection." Symptoms of such a bacterial infection may include:  Worsening sore throat with pus and difficulty swallowing.  Swollen neck glands.  Chills and a high or persistent fever.  Severe headache.  Tenderness over the sinuses.  Persistent overall ill feeling (malaise), muscle aches, and tiredness (fatigue).  Persistent cough.  Yellow, green, or brown mucus production with coughing. HOME CARE INSTRUCTIONS   Only take over-the-counter or prescription medicines for pain, discomfort, diarrhea, or fever as directed by your caregiver.  Drink enough water and fluids to keep your urine clear or pale yellow. Sports drinks can provide valuable electrolytes, sugars, and hydration.  Get plenty of rest and maintain proper nutrition. Soups and broths with crackers or rice are fine. SEEK IMMEDIATE MEDICAL CARE IF:   You have severe headaches, shortness of breath, chest pain, neck pain, or an unusual rash.  You have uncontrolled vomiting, diarrhea, or you are unable to keep down fluids.  You or your child has an oral temperature above 102 F (38.9 C), not controlled by medicine.  Your baby is older than 3 months with a rectal temperature of 102 F (38.9 C) or higher.  Your baby is 45 months old or younger with a rectal temperature of 100.4 F (38 C) or higher. MAKE SURE YOU:   Understand these instructions.  Will watch your condition.  Will get help right away if you are not doing well or get worse. Document Released: 12/24/2004 Document Revised: 06/08/2011 Document Reviewed:  07/21/2010 [facility] Patient Information 2015 [place], [place]. This information is not intended to replace advice given to you by your health care provider. Make sure you discuss any questions you have with your health care provider.   Viral Gastroenteritis Viral gastroenteritis is also known as stomach flu. This condition affects the stomach and intestinal tract. It can cause sudden diarrhea and vomiting. The illness typically lasts 3 to 8 days. Most people develop an immune response that eventually gets rid of the virus. While this natural response develops, the virus can make you quite ill. CAUSES  Many different viruses can cause gastroenteritis, such as rotavirus or noroviruses. You can catch one of these viruses by consuming contaminated food or water. You may also catch a virus by sharing utensils or other personal items with an infected person or by touching a contaminated surface. SYMPTOMS  The most common symptoms are diarrhea and vomiting. These problems can cause a severe loss of body fluids (dehydration) and a body salt (electrolyte) imbalance. Other symptoms may include:  Fever.  Headache.  Fatigue.  Abdominal pain. DIAGNOSIS  Your caregiver can usually diagnose viral gastroenteritis based on your symptoms and a physical exam. A stool sample may also be taken to test for the presence of viruses or other infections. TREATMENT  This illness typically goes away on its own. Treatments are aimed  at rehydration. The most serious cases of viral gastroenteritis involve vomiting so severely that you are not able to keep fluids down. In these cases, fluids must be given through an intravenous line (IV). HOME CARE INSTRUCTIONS   Drink enough fluids to keep your urine clear or pale yellow. Drink small amounts of fluids frequently and increase the amounts as tolerated.  Ask your caregiver for specific rehydration instructions.  Avoid:  Foods high in sugar.  Alcohol.  Carbonated  drinks.  Tobacco.  Juice.  Caffeine drinks.  Extremely hot or cold fluids.  Fatty, greasy foods.  Too much intake of anything at one time.  Dairy products until 24 to 48 hours after diarrhea stops.  You may consume probiotics. Probiotics are active cultures of beneficial bacteria. They may lessen the amount and number of diarrheal stools in adults. Probiotics can be found in yogurt with active cultures and in supplements.  Wash your hands well to avoid spreading the virus.  Only take over-the-counter or prescription medicines for pain, discomfort, or fever as directed by your caregiver. Do not give aspirin to children. Antidiarrheal medicines are not recommended.  Ask your caregiver if you should continue to take your regular prescribed and over-the-counter medicines.  Keep all follow-up appointments as directed by your caregiver. SEEK IMMEDIATE MEDICAL CARE IF:   You are unable to keep fluids down.  You do not urinate at least once every 6 to 8 hours.  You develop shortness of breath.  You notice blood in your stool or vomit. This may look like coffee grounds.  You have abdominal pain that increases or is concentrated in one small area (localized).  You have persistent vomiting or diarrhea.  You have a fever.  The patient is a child younger than 3 months, and he or she has a fever.  The patient is a child older than 3 months, and he or she has a fever and persistent symptoms.  The patient is a child older than 3 months, and he or she has a fever and symptoms suddenly get worse.  The patient is a baby, and he or she has no tears when crying. MAKE SURE YOU:   Understand these instructions.  Will watch your condition.  Will get help right away if you are not doing well or get worse. Document Released: 03/16/2005 Document Revised: 06/08/2011 Document Reviewed: 12/31/2010 [facility] Patient Information 2015 [place], [place]. This information is not intended to replace  advice given to you by your health care provider. Make sure you discuss any questions you have with your health care provider.  Influenza Influenza ("the flu") is a viral infection of the respiratory tract. It occurs more often in winter months because people spend more time in close contact with one another. Influenza can make you feel very sick. Influenza easily spreads from person to person (contagious). CAUSES  Influenza is caused by a virus that infects the respiratory tract. You can catch the virus by breathing in droplets from an infected person's cough or sneeze. You can also catch the virus by touching something that was recently contaminated with the virus and then touching your mouth, nose, or eyes. RISKS AND COMPLICATIONS You may be at risk for a more severe case of influenza if you smoke cigarettes, have diabetes, have chronic heart disease (such as heart failure) or lung disease (such as asthma), or if you have a weakened immune system. Elderly people and pregnant women are also at risk for more serious infections. The most common  problem of influenza is a lung infection (pneumonia). Sometimes, this problem can require emergency medical care and may be life threatening. SIGNS AND SYMPTOMS  Symptoms typically last 4 to 10 days and may include:  Fever.  Chills.  Headache, body aches, and muscle aches.  Sore throat.  Chest discomfort and cough.  Poor appetite.  Weakness or feeling tired.  Dizziness.  Nausea or vomiting. DIAGNOSIS  Diagnosis of influenza is often made based on your history and a physical exam. A nose or throat swab test can be done to confirm the diagnosis. TREATMENT  In mild cases, influenza goes away on its own. Treatment is directed at relieving symptoms. For more severe cases, your health care provider may prescribe antiviral medicines to shorten the sickness. Antibiotic medicines are not effective because the infection is caused by a virus, not by  bacteria. HOME CARE INSTRUCTIONS  Take medicines only as directed by your health care provider.  Use a cool mist humidifier to make breathing easier.  Get plenty of rest until your temperature returns to normal. This usually takes 3 to 4 days.  Drink enough fluid to keep your urine clear or pale yellow.  Cover yourmouth and nosewhen coughing or sneezing,and wash your handswellto prevent thevirusfrom spreading.  Stay homefromwork orschool untilthe fever is gonefor at least 261full day. PREVENTION  An annual influenza vaccination (flu shot) is the best way to avoid getting influenza. An annual flu shot is now routinely recommended for all adults in the [place] SEEK MEDICAL CARE IF:  You experiencechest pain, yourcough worsens,or you producemore mucus.  Youhave nausea,vomiting, ordiarrhea.  Your fever returns or gets worse. SEEK IMMEDIATE MEDICAL CARE IF:  You havetrouble breathing, you become short of breath,or your skin ornails becomebluish.  You have severe painor stiffnessin the neck.  You develop a sudden headache, or pain in the face or ear.  You have nausea or vomiting that you cannot control. MAKE SURE YOU:   Understand these instructions.  Will watch your condition.  Will get help right away if you are not doing well or get worse. Document Released: 03/13/2000 Document Revised: 07/31/2013 Document Reviewed: 06/15/2011 [facility] Patient Information 2015 [place], [place]. This information is not intended to replace advice given to you by your health care provider. Make sure you discuss any questions you have with your health care provider.

## 2014-06-15 LAB — URINE CULTURE
Colony Count: NO GROWTH
Organism ID, Bacteria: NO GROWTH

## 2014-07-20 ENCOUNTER — Encounter: Payer: Self-pay | Admitting: Family Medicine

## 2015-07-04 ENCOUNTER — Ambulatory Visit (INDEPENDENT_AMBULATORY_CARE_PROVIDER_SITE_OTHER): Payer: BLUE CROSS/BLUE SHIELD | Admitting: Family Medicine

## 2015-07-04 VITALS — BP 116/60 | HR 81 | Temp 98.0°F | Resp 16 | Ht 64.5 in | Wt 149.0 lb

## 2015-07-04 DIAGNOSIS — B360 Pityriasis versicolor: Secondary | ICD-10-CM

## 2015-07-04 DIAGNOSIS — Z283 Underimmunization status: Secondary | ICD-10-CM

## 2015-07-04 DIAGNOSIS — R21 Rash and other nonspecific skin eruption: Secondary | ICD-10-CM | POA: Diagnosis not present

## 2015-07-04 DIAGNOSIS — Z2839 Other underimmunization status: Secondary | ICD-10-CM

## 2015-07-04 DIAGNOSIS — Z23 Encounter for immunization: Secondary | ICD-10-CM | POA: Diagnosis not present

## 2015-07-04 DIAGNOSIS — Z111 Encounter for screening for respiratory tuberculosis: Secondary | ICD-10-CM

## 2015-07-04 LAB — POCT SKIN KOH: Skin KOH, POC: POSITIVE

## 2015-07-04 MED ORDER — ITRACONAZOLE 100 MG PO CAPS: 100.0000 mg | ORAL_CAPSULE | Freq: Every day | ORAL | Status: DC

## 2015-07-04 NOTE — Progress Notes (Signed)
Patient ID: Steven Campos, male    DOB: 06-Aug-1975  Age: 40 y.o. MRN: [id number]  Chief Complaint  Patient presents with  . Immunizations    Quantiferon TB, MMR  . Rash    neck area    Subjective:   Pleasant gentleman, grew up in the [place], has not been febrile for about 10 years. He is going to be getting a job which requires him to get a QuantiFERON Gold and an MMR. He also has a dermatitis on the right side of his neck that he would like to get checked. It is been there intermittently for a long time. No other major problems or concerns.  Current allergies, medications, problem list, past/family and social histories reviewed.  Objective:  BP 116/60 mmHg  Pulse 81  Temp(Src) 98 F (36.7 C)  Resp 16  Ht 5' 4.5" (1.638 m)  Wt 149 lb (67.586 kg)  BMI 25.19 kg/m2  SpO2 98%  Pleasant gentleman, alert and oriented. Appears healthy. Not examined in detail. He does have a splotchy area of abnormal pigmentation of his skin on the right side of his neck down onto the right upper chest wall. No other obvious patches noted.  Assessment & Plan:   Assessment: 1. Screening-pulmonary TB   2. Immunization deficiency   3. Rash of neck   4. Tinea versicolor       Plan: Tinea appearing rash, probably tinea versicolor. We'll do skin scraping to see if we can confirm, but he will need to be treated anyhow. Draw the QuantiFERON Gold and give him his MMR while he is here today. He would've had BCG vaccination when he was young. 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  Orders Placed This Encounter  Procedures  . MMR vaccine subcutaneous  . Quantiferon tb gold assay (blood)  . POCT Skin KOH    Meds ordered this encounter  Medications  . itraconazole (SPORANOX) 100 MG capsule    Sig: Take 1 capsule (100 mg total) by mouth daily.    Dispense:  5 capsule    Refill:  0   Results for orders placed or performed in visit on  07/04/15  POCT Skin KOH  Result Value Ref Range   Skin KOH, POC Positive          Patient Instructions   You have received the MMR vaccine.  We will let you know the results of the QuantiFERON Gold in the next couple of days. If you have not gotten word about it by this weekend you can call Sunday or Monday for the results or come back for a copy of it  I recommend you signed up for the My Chart so you can access your own labs on   You have a type of rash called tinea versicolor. We are treating this with a antifungal medication 1 pill daily for 5 days  You can also use on the rash some Selsun Blue shampoo, rub it on like you would a motion, let it dry for about one half hour, then wash it off. Do this every day for several weeks and it usually will clear the rash also.  After any treatment of the rash is sometimes takes the skin several months to look normal.  Tinea Versicolor Tinea versicolor is a common fungal infection of the skin. It causes a rash that appears as light or dark patches on the skin. The rash most often occurs  on the chest, back, neck, or upper arms. This condition is more common during warm weather. Other than affecting how your skin looks, tinea versicolor usually does not cause other problems. In most cases, the infection goes away in a few weeks with treatment. It may take a few months for the patches on your skin to clear up. CAUSES Tinea versicolor occurs when a type of fungus that is normally present on the skin starts to overgrow. This fungus is a kind of yeast. The exact cause of the overgrowth is not known. This condition cannot be passed from one person to another (noncontagious). RISK FACTORS This condition is more likely to develop when certain factors are present, such as:  Heat and humidity.  Sweating too much.  Hormone changes.  Oily skin.  A weak defense (immune) system. SYMPTOMS Symptoms of this condition may include:  A rash on your  skin that is made up of light or dark patches. The rash may have:  Patches of tan or pink spots on light skin.  Patches of white or brown spots on dark skin.  Patches of skin that do not tan.  Well-marked edges.  Scales on the discolored areas.  Mild itching. DIAGNOSIS A health care provider can usually diagnose this condition by looking at your skin. During the exam, he or she may use ultraviolet light to help determine the extent of the infection. In some cases, a skin sample may be taken by scraping the rash. This sample will be viewed under a microscope to check for yeast overgrowth. TREATMENT Treatment for this condition may include:  Dandruff shampoo that is applied to the affected skin during showers or bathing.  Over-the-counter medicated skin cream, lotion, or soaps.  Prescription antifungal medicine in the form of skin cream or pills.  Medicine to help reduce itching. HOME CARE INSTRUCTIONS  Take medicines only as directed by your health care provider.  Apply dandruff shampoo to the affected area if told to do so by your health care provider. You may be instructed to scrub the affected skin for several minutes each day.  Do not scratch the affected area of skin.  Avoid hot and humid conditions.  Do not use tanning booths.  Try to avoid sweating a lot. SEEK MEDICAL CARE IF:  Your symptoms get worse.  You have a fever.  You have redness, swelling, or pain at the site of your rash.  You have fluid, blood, or pus coming from your rash.  Your rash returns after treatment.   This information is not intended to replace advice given to you by your health care provider. Make sure you discuss any questions you have with your health care provider.   Document Released: 03/13/2000 Document Revised: 04/06/2014 Document Reviewed: 12/26/2013 Elsevier Interactive Patient Education 2016 [employer].   IF you received an x-ray today, you will receive an invoice from  [facility]. Please contact [facility] at [phone] with questions or concerns regarding your invoice.   IF you received labwork today, you will receive an invoice from [employer]. Please contact [facility] at [phone] with questions or concerns regarding your invoice.   Our billing staff will not be able to assist you with questions regarding bills from these companies.  You will be contacted with the lab results as soon as they are available. The fastest way to get your results is to activate your My Chart account. Instructions are located on the last page of this paperwork. If you  have not heard from [place] regarding the results in 2 weeks, please contact this office.          Return if symptoms worsen or fail to improve.   Jamorion Gomillion, MD 07/04/2015

## 2015-07-04 NOTE — Patient Instructions (Addendum)
You have received the MMR vaccine.  We will let you know the results of the QuantiFERON Gold in the next couple of days. If you have not gotten word about it by this weekend you can call Sunday or Monday for the results or come back for a copy of it  I recommend you signed up for the My Chart so you can access your own labs on   You have a type of rash called tinea versicolor. We are treating this with a antifungal medication 1 pill daily for 5 days  You can also use on the rash some Selsun Blue shampoo, rub it on like you would a motion, let it dry for about one half hour, then wash it off. Do this every day for several weeks and it usually will clear the rash also.  After any treatment of the rash is sometimes takes the skin several months to look normal.  Tinea Versicolor Tinea versicolor is a common fungal infection of the skin. It causes a rash that appears as light or dark patches on the skin. The rash most often occurs on the chest, back, neck, or upper arms. This condition is more common during warm weather. Other than affecting how your skin looks, tinea versicolor usually does not cause other problems. In most cases, the infection goes away in a few weeks with treatment. It may take a few months for the patches on your skin to clear up. CAUSES Tinea versicolor occurs when a type of fungus that is normally present on the skin starts to overgrow. This fungus is a kind of yeast. The exact cause of the overgrowth is not known. This condition cannot be passed from one person to another (noncontagious). RISK FACTORS This condition is more likely to develop when certain factors are present, such as:  Heat and humidity.  Sweating too much.  Hormone changes.  Oily skin.  A weak defense (immune) system. SYMPTOMS Symptoms of this condition may include:  A rash on your skin that is made up of light or dark patches. The rash may have:  Patches of tan or pink spots on light  skin.  Patches of white or brown spots on dark skin.  Patches of skin that do not tan.  Well-marked edges.  Scales on the discolored areas.  Mild itching. DIAGNOSIS A health care provider can usually diagnose this condition by looking at your skin. During the exam, he or she may use ultraviolet light to help determine the extent of the infection. In some cases, a skin sample may be taken by scraping the rash. This sample will be viewed under a microscope to check for yeast overgrowth. TREATMENT Treatment for this condition may include:  Dandruff shampoo that is applied to the affected skin during showers or bathing.  Over-the-counter medicated skin cream, lotion, or soaps.  Prescription antifungal medicine in the form of skin cream or pills.  Medicine to help reduce itching. HOME CARE INSTRUCTIONS  Take medicines only as directed by your health care provider.  Apply dandruff shampoo to the affected area if told to do so by your health care provider. You may be instructed to scrub the affected skin for several minutes each day.  Do not scratch the affected area of skin.  Avoid hot and humid conditions.  Do not use tanning booths.  Try to avoid sweating a lot. SEEK MEDICAL CARE IF:  Your symptoms get worse.  You have a fever.  You have redness, swelling, or pain  at the site of your rash.  You have fluid, blood, or pus coming from your rash.  Your rash returns after treatment.   This information is not intended to replace advice given to you by your health care provider. Make sure you discuss any questions you have with your health care provider.   Document Released: 03/13/2000 Document Revised: 04/06/2014 Document Reviewed: 12/26/2013 Elsevier Interactive Patient Education 2016 [employer].   IF you received an x-ray today, you will receive an invoice from [facility]. Please contact [facility] at [phone] with questions or concerns  regarding your invoice.   IF you received labwork today, you will receive an invoice from [employer]. Please contact [facility] at [phone] with questions or concerns regarding your invoice.   Our billing staff will not be able to assist you with questions regarding bills from these companies.  You will be contacted with the lab results as soon as they are available. The fastest way to get your results is to activate your My Chart account. Instructions are located on the last page of this paperwork. If you have not heard from [place] regarding the results in 2 weeks, please contact this office.

## 2015-07-06 LAB — QUANTIFERON TB GOLD ASSAY (BLOOD)
Interferon Gamma Release Assay: POSITIVE — AB
Mitogen-Nil: 8.48 IU/mL
Quantiferon Nil Value: 0.88 IU/mL
Quantiferon Tb Ag Minus Nil Value: 8.48 IU/mL

## 2015-07-11 ENCOUNTER — Telehealth: Payer: Self-pay

## 2015-07-11 NOTE — Telephone Encounter (Signed)
PA completed for Itraconazole on covermymeds. Approved through 10/08/15. Notified pharm.

## 2015-07-19 ENCOUNTER — Ambulatory Visit (INDEPENDENT_AMBULATORY_CARE_PROVIDER_SITE_OTHER): Payer: BLUE CROSS/BLUE SHIELD

## 2015-07-19 ENCOUNTER — Ambulatory Visit (INDEPENDENT_AMBULATORY_CARE_PROVIDER_SITE_OTHER): Payer: BLUE CROSS/BLUE SHIELD | Admitting: Family Medicine

## 2015-07-19 VITALS — BP 110/70 | HR 76 | Temp 98.1°F | Resp 18 | Ht 64.5 in | Wt 145.7 lb

## 2015-07-19 DIAGNOSIS — R7612 Nonspecific reaction to cell mediated immunity measurement of gamma interferon antigen response without active tuberculosis: Secondary | ICD-10-CM | POA: Diagnosis not present

## 2015-07-19 NOTE — Progress Notes (Signed)
Steven Campos is a 40 y.o. male who presents to Urgent Care today for positive quad to fearing gold test. Patient recently had chest pain for tuberculosis as part of his job. [place] originally [place] and in [place] to [place]. 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 He recently was tested and had a positive quadrant furuncle test. He denies any cough fevers or chills and feels well.   Past Medical History  Diagnosis Date  . Chest pain     with left shoulder pain   History reviewed. No pertinent past surgical history. Social History  Substance Use Topics  . Smoking status: Never Smoker   . Smokeless tobacco: Never Used  . Alcohol Use: No   ROS as above Medications: Current Outpatient Prescriptions  Medication Sig Dispense Refill  . itraconazole (SPORANOX) 100 MG capsule Take 1 capsule (100 mg total) by mouth daily. 5 capsule 0  . benzonatate (TESSALON) 100 MG capsule Take 1-2 capsules (100-200 mg total) by mouth 3 (three) times daily as needed for cough. (Patient not taking: Reported on 07/04/2015) 60 capsule 0  . chlorpheniramine-HYDROcodone (TUSSIONEX PENNKINETIC ER) 10-8 MG/5ML LQCR Take 5 mLs by mouth every 12 (twelve) hours as needed. (Patient not taking: Reported on 07/04/2015) 120 mL 0  . omeprazole (PRILOSEC) 40 MG capsule Take 1 capsule (40 mg total) by mouth daily. (Patient not taking: Reported on 07/04/2015) 30 capsule 1  . promethazine (PHENERGAN) 25 MG tablet Take 1 tablet (25 mg total) by mouth every 8 (eight) hours as needed for nausea or vomiting. (Patient not taking: Reported on 07/04/2015) 20 tablet 0  . simethicone (GAS-X) 80 MG chewable tablet Chew 1 tablet (80 mg total) by mouth every 6 (six) hours as needed for flatulence. (Patient not taking: Reported on 07/04/2015) 30 tablet 0   No current facility-administered medications for this visit.   No Known Allergies   Exam:  BP 110/70  mmHg  Pulse 76  Temp(Src) 98.1 F (36.7 C) (Oral)  Resp 18  Ht 5' 4.5" (1.638 m)  Wt 145 lb 11.2 oz (66.089 kg)  BMI 24.63 kg/m2  SpO2 98% Gen: Well NAD HEENT: EOMI,  MMM Lungs: Normal work of breathing. CTABL Heart: RRR no MRG Abd: NABS, Soft. Nondistended, Nontender Exts: Brisk capillary refill, warm and well perfused.   No results found for this or any previous visit (from the past 24 hour(s)). Dg Chest 1 View  07/19/2015  CLINICAL DATA:  Positive QuantiFERON gold.  Rule out TB EXAM: CHEST 1 VIEW COMPARISON:  06/14/2014 FINDINGS: Normal heart size. No pleural effusion or edema. No airspace consolidation. The visualized osseous structures appear unremarkable. IMPRESSION: 1. No acute cardiopulmonary abnormality. Electronically Signed   By: Signa Kellaylor  Stroud M.D.   On: 07/19/2015 17:29    Assessment and Plan: [age] y.o. male with positive QuantiFERON gold test with no symptoms and normal chest x-ray. Patient clearly does not have active tuberculosis. 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  Discussed warning signs or symptoms. Please see discharge instructions. Patient expresses understanding.

## 2015-07-19 NOTE — Patient Instructions (Addendum)
  Thank you for coming in today. Please follow up with the health department.  We cannot say for sure if you do not need treatment for latent TB.      IF you received an x-ray today, you will receive an invoice from [facility]. Please contact [facility] at [phone] with questions or concerns regarding your invoice.   IF you received labwork today, you will receive an invoice from [facility]. Please contact [facility] at [phone] with questions or concerns regarding your invoice.   Our billing staff will not be able to assist you with questions regarding bills from these companies.  You will be contacted with the lab results as soon as they are available. The fastest way to get your results is to activate your My Chart account. Instructions are located on the last page of this paperwork. If you have not heard from us regarding the results in 2 weeks, please contact this office.

## 2015-11-20 ENCOUNTER — Ambulatory Visit (INDEPENDENT_AMBULATORY_CARE_PROVIDER_SITE_OTHER): Payer: BLUE CROSS/BLUE SHIELD | Admitting: Family Medicine

## 2015-11-20 ENCOUNTER — Ambulatory Visit (INDEPENDENT_AMBULATORY_CARE_PROVIDER_SITE_OTHER): Payer: BLUE CROSS/BLUE SHIELD

## 2015-11-20 VITALS — BP 110/68 | HR 70 | Temp 98.8°F | Resp 16 | Ht 64.5 in | Wt 150.0 lb

## 2015-11-20 DIAGNOSIS — M25552 Pain in left hip: Secondary | ICD-10-CM | POA: Diagnosis not present

## 2015-11-20 DIAGNOSIS — B360 Pityriasis versicolor: Secondary | ICD-10-CM | POA: Diagnosis not present

## 2015-11-20 MED ORDER — KETOCONAZOLE 2 % EX CREA: 1.0000 "application " | TOPICAL_CREAM | Freq: Every day | CUTANEOUS | 0 refills | Status: DC

## 2015-11-20 MED ORDER — IBUPROFEN 600 MG PO TABS: 600.0000 mg | ORAL_TABLET | Freq: Three times a day (TID) | ORAL | 0 refills | Status: DC | PRN

## 2015-11-20 NOTE — Patient Instructions (Addendum)
Do the leg stretches as I told you.  Take the Ibuprfoen as needed for pain relief.  Use the cream to help with the rash.      IF you received an x-ray today, you will receive an invoice from [facility]. Please contact [facility] at [phone] with questions or concerns regarding your invoice.   IF you received labwork today, you will receive an invoice from [facility]. Please contact [facility] at [phone] with questions or concerns regarding your invoice.   Our billing staff will not be able to assist you with questions regarding bills from these companies.  You will be contacted with the lab results as soon as they are available. The fastest way to get your results is to activate your My Chart account. Instructions are located on the last page of this paperwork. If you have not heard from us regarding the results in 2 weeks, please contact this office.

## 2015-11-20 NOTE — Progress Notes (Signed)
Steven RidingFelix Campos is a 40 y.o. male who presents to Urgent Care today for hip pain and rash:  1.  Hip pain:  Present for past several days. No injuries that he knows of.  No falls.  No swelling.  States its worse when he tries to stand or first attempt to walk.  Better after walkijng for a few steps.  No other prior injuries. Pain does not radiate down his leg.    2.  Rash:  Present previously, states this comes and goes.  Has been treated with lamisil 2 years ago without much relief.  Worse when hot, no real itching.  Hasn't tried anything else for relief.   ROS as above.  Pertinently, no chest pain, palpitations, SOB, Fever, Chills, Abd pain, N/V/D.   PMH reviewed. Patient is a nonsmoker.   Past Medical History:  Diagnosis Date  . Chest pain    with left shoulder pain   No past surgical history on file.  Medications reviewed. No current outpatient prescriptions on file.   No current facility-administered medications for this visit.      Physical Exam:  BP 110/68 (BP Location: Right Arm, Patient Position: Sitting, Cuff Size: Small)   Pulse 70   Temp 98.8 F (37.1 C) (Oral)   Resp 16   Ht 5' 4.5" (1.638 m)   Wt 150 lb (68 kg)   SpO2 97%   BMI 25.35 kg/m  Gen:  Alert, cooperative patient who appears stated age in no acute distress.  Vital signs reviewed. HEENT: EOMI,  MMM MSK:  Some tenderness direclty over piriformis.  Faber positive.  No back pain on entire examination.  Full forward flexion without pain.  Skin:  Scaly patches scattered across back and neck.    Assessment and Plan:  1.  Right hip pain: - likely sciatica vs piriformis.  Treat with Ibuprofen 800 mg when needed.  Heat if needed. - negative hip xray - No back pain.  No red flags.  - Stretching and gluteal strengthing.  FU if no improvement in 1-2 weeks.   2.  Tinea versicolor: - ketoconazole cream to treat

## 2016-06-05 ENCOUNTER — Ambulatory Visit (INDEPENDENT_AMBULATORY_CARE_PROVIDER_SITE_OTHER): Payer: BLUE CROSS/BLUE SHIELD | Admitting: Physician Assistant

## 2016-06-05 VITALS — BP 100/68 | HR 68 | Temp 98.3°F | Resp 16 | Ht 64.5 in | Wt 146.6 lb

## 2016-06-05 DIAGNOSIS — L989 Disorder of the skin and subcutaneous tissue, unspecified: Secondary | ICD-10-CM

## 2016-06-05 DIAGNOSIS — Z13220 Encounter for screening for lipoid disorders: Secondary | ICD-10-CM

## 2016-06-05 DIAGNOSIS — Z1329 Encounter for screening for other suspected endocrine disorder: Secondary | ICD-10-CM

## 2016-06-05 DIAGNOSIS — Z0000 Encounter for general adult medical examination without abnormal findings: Secondary | ICD-10-CM

## 2016-06-05 DIAGNOSIS — Z130 Encounter for screening for diseases of the blood and blood-forming organs and certain disorders involving the immune mechanism: Secondary | ICD-10-CM | POA: Diagnosis not present

## 2016-06-05 DIAGNOSIS — Z1389 Encounter for screening for other disorder: Secondary | ICD-10-CM | POA: Diagnosis not present

## 2016-06-05 DIAGNOSIS — Z13228 Encounter for screening for other metabolic disorders: Secondary | ICD-10-CM

## 2016-06-05 LAB — POCT URINALYSIS DIP (MANUAL ENTRY)
BILIRUBIN UA: NEGATIVE
Glucose, UA: NEGATIVE
Ketones, POC UA: NEGATIVE
Leukocytes, UA: NEGATIVE
NITRITE UA: NEGATIVE
PH UA: 6
PROTEIN UA: NEGATIVE
RBC UA: NEGATIVE
Spec Grav, UA: 1.015
UROBILINOGEN UA: 0.2

## 2016-06-05 NOTE — Patient Instructions (Addendum)
Keep up the good work. It was great meeting you today. Our office will contact you with your lab results. Please have your wife come in and see me for annual physical exam.   Thank you for letting me participate in your health and well being.  Here is some information about skin tags.   Skin Tag, Adult A skin tag (acrochordon) is a soft, extra growth of skin. Most skin tags are flesh-colored and rarely bigger than a pencil eraser. They commonly form near areas where there are folds in the skin, such as the armpit or groin. Skin tags are not dangerous, and they do not spread from person to person (are not contagious). You may have one skin tag or several. Skin tags do not require treatment. However, your health care provider may recommend removal of a skin tag if it:  Gets irritated from clothing.  Bleeds.  Is visible and unsightly. Your health care provider can remove skin tags with a simple surgical procedure or a procedure that involves freezing the skin tag. Follow these instructions at home:  Watch for any changes in your skin tag. A normal skin tag does not require any other special care at home.  Take over-the-counter and prescription medicines only as told by your health care provider.  Keep all follow-up visits as told by your health care provider. This is important. Contact a health care provider if:  You have a skin tag that:  Becomes painful.  Changes color.  Bleeds.  Swells.  You develop more skin tags. This information is not intended to replace advice given to you by your health care provider. Make sure you discuss any questions you have with your health care provider. Document Released: 03/31/2015 Document Revised: 11/10/2015 Document Reviewed: 03/31/2015 Elsevier Interactive Patient Education  2017 [employer]    IF you received an x-ray today, you will receive an invoice from [facility]. Please contact [facility] at [phone] with  questions or concerns regarding your invoice.   IF you received labwork today, you will receive an invoice from [facility]. Please contact [facility] at [phone] with questions or concerns regarding your invoice.   Our billing staff will not be able to assist you with questions regarding bills from these companies.  You will be contacted with the lab results as soon as they are available. The fastest way to get your results is to activate your My Chart account. Instructions are located on the last page of this paperwork. If you have not heard from us regarding the results in 2 weeks, please contact this office.

## 2016-06-05 NOTE — Progress Notes (Signed)
Steven Campos  MRN: [id number] DOB: 1975/06/27  Subjective:  Pt is a 41 y.o. male who presents for annual physical exam and removal of skin tags. Pt is not fasting today. He ate a doughnut and a banana one hour before arrival.   Social: He moved here from [place] in [place]. Works with refugees. He has a wife and four kids.  Diet: He is fu fu, rice, potatoes, and beans. He drinks water and milk.   Exercise: He does not perform much structured exercise.   Bowel Movements: Notes they are normal, once a day, does not have to strain.   Last dental exam: years ago  Last vision exam: years ago Last colonoscopy: Never  He is up to date on flu vaccine. He has had his tetanus vaccine in the past 5 years.       Skin tags: He has had them for at least a year. Denies pain. They just irritate him because they are in places where things constantly rub.   Patient Active Problem List   Diagnosis Date Noted  . Chest pain 09/04/2010    No current outpatient prescriptions on file prior to visit.   No current facility-administered medications on file prior to visit.     No Known Allergies  Social History   Social History  . Marital status: Married    Spouse name: N/A  . Number of children: N/A  . Years of education: N/A   Social History Main Topics  . Smoking status: Never Smoker  . Smokeless tobacco: Never Used  . Alcohol use No  . Drug use: No  . Sexual activity: No   Other Topics Concern  . None   Social History Narrative  . None    No past surgical history on file.  No family history on file.  Review of Systems  Constitutional: Negative for activity change, appetite change, chills, diaphoresis, fatigue, fever and unexpected weight change.  HENT: Negative for congestion, dental problem, drooling, ear discharge, ear pain, facial swelling, hearing loss, mouth sores, nosebleeds, postnasal drip, rhinorrhea, sinus pain, sinus pressure, sneezing, sore throat, tinnitus,  trouble swallowing and voice change.   Eyes: Negative for photophobia, pain, discharge, redness, itching and visual disturbance.  Respiratory: Negative for apnea, cough, choking, chest tightness, shortness of breath, wheezing and stridor.   Cardiovascular: Negative for chest pain, palpitations and leg swelling.  Gastrointestinal: Negative for abdominal distention, abdominal pain, anal bleeding, blood in stool, constipation, diarrhea, nausea, rectal pain and vomiting.  Endocrine: Negative for cold intolerance, heat intolerance, polydipsia, polyphagia and polyuria.  Genitourinary: Negative for decreased urine volume, difficulty urinating, discharge, dysuria, enuresis, flank pain, frequency, genital sores, hematuria, penile pain, penile swelling, scrotal swelling, testicular pain and urgency.  Musculoskeletal: Negative for arthralgias, back pain, gait problem, joint swelling, myalgias, neck pain and neck stiffness.  Skin: Negative for color change, pallor, rash and wound.  Allergic/Immunologic: Negative for environmental allergies, food allergies and immunocompromised state.  Neurological: Negative for dizziness, tremors, seizures, syncope, facial asymmetry, speech difficulty, weakness, light-headedness, numbness and headaches.  Hematological: Negative for adenopathy. Does not bruise/bleed easily.  Psychiatric/Behavioral: Negative for agitation, behavioral problems, confusion, decreased concentration, dysphoric mood, hallucinations, self-injury, sleep disturbance and suicidal ideas. The patient is not nervous/anxious and is not hyperactive.     Objective:  BP 100/68   Pulse 68   Temp 98.3 F (36.8 C) (Oral)   Resp 16   Ht 5' 4.5" (1.638 m)   Wt 146 lb 9.6 oz (66.5  kg)   SpO2 98%   BMI 24.78 kg/m   Physical Exam  Constitutional: He is oriented to person, place, and time and well-developed, well-nourished, and in no distress.  HENT:  Head: Normocephalic and atraumatic.  Right Ear: Hearing,  tympanic membrane, external ear and ear canal normal.  Left Ear: Hearing, tympanic membrane, external ear and ear canal normal.  Nose: Nose normal.  Mouth/Throat: Uvula is midline, oropharynx is clear and moist and mucous membranes are normal. No oropharyngeal exudate.  Eyes: Conjunctivae and EOM are normal. Pupils are equal, round, and reactive to light.  Neck: Trachea normal and normal range of motion.  Cardiovascular: Normal rate, regular rhythm, normal heart sounds and intact distal pulses.   Pulmonary/Chest: Effort normal and breath sounds normal.  Abdominal: Soft. Normal appearance and bowel sounds are normal.  Musculoskeletal: Normal range of motion.  Lymphadenopathy:       Head (right side): No submental, no submandibular, no tonsillar, no preauricular, no posterior auricular and no occipital adenopathy present.       Head (left side): No submental, no submandibular, no tonsillar, no preauricular, no posterior auricular and no occipital adenopathy present.    He has no cervical adenopathy.       Right: No supraclavicular adenopathy present.       Left: No supraclavicular adenopathy present.  Neurological: He is alert and oriented to person, place, and time. He has normal sensation, normal strength and normal reflexes. Gait normal.  Skin: Skin is warm and dry.  Two 1-18m soft, flesh colored skin growths noted on neck and waist line. One 0.75cm soft, flesh colored skin growth noted on left posterior trunk.    Psychiatric: Affect normal.  Vitals reviewed.      Visual Acuity Screening   Right eye Left eye Both eyes  Without correction: '20/20 20/15 20/13 '  With correction:      Results for orders placed or performed in visit on 06/05/16 (from the past 24 hour(s))  POCT urinalysis dipstick     Status: None   Collection Time: 06/05/16  9:46 AM  Result Value Ref Range   Color, UA yellow yellow   Clarity, UA clear clear   Glucose, UA negative negative   Bilirubin, UA negative  negative   Ketones, POC UA negative negative   Spec Grav, UA 1.015    Blood, UA negative negative   pH, UA 6.0    Protein Ur, POC negative negative   Urobilinogen, UA 0.2    Nitrite, UA Negative Negative   Leukocytes, UA Negative Negative    Procedure Note: Skin Tag Removal:  Verbal consent obtain from the patient.  Skin cleansed with alcohol pad, the two mm lesions were excisted with iris scissors without local anesthesia. One larger lesion on posterior trunk was cleansed with alcohol pain,  then local anesthesia with 1 cc 1% lidocaine with epinephrine was used. Lesion was excised with iris scissors. Lesion was sent to pathology. Patient tolerated well. Minimal bleeding noted. Bandage applied to all sites. Local care reviewed.   Assessment and Plan :  Discussed healthy lifestyle, diet, exercise, preventative care, vaccinations, and addressed patient's concerns. Plan for follow up in one year. Otherwise, plan for specific conditions below.  1. Annual physical exam Await lab results   2. Screening, anemia, deficiency, iron - CBC with Differential/Platelet  3. Screening, lipid - Lipid panel  4. Screening for thyroid disorder - TSH  5. Screening for hematuria or proteinuria - POCT urinalysis dipstick  6. Screening  for metabolic disorder - TCC88+FDVO  7. Skin lesion -Consistent with skin tags. Wound care instructions given.  - Dermatology pathology   Tenna Delaine PA-C  [facility] 06/05/2016 8:49 AM

## 2016-06-06 LAB — CBC WITH DIFFERENTIAL/PLATELET
BASOS ABS: 0 10*3/uL (ref 0.0–0.2)
Basos: 0 %
EOS (ABSOLUTE): 0.1 10*3/uL (ref 0.0–0.4)
Eos: 3 %
Hematocrit: 44.7 % (ref 37.5–51.0)
Hemoglobin: 15.4 g/dL (ref 13.0–17.7)
Immature Grans (Abs): 0 10*3/uL (ref 0.0–0.1)
Immature Granulocytes: 0 %
LYMPHS ABS: 2.5 10*3/uL (ref 0.7–3.1)
Lymphs: 53 %
MCH: 30.4 pg (ref 26.6–33.0)
MCHC: 34.5 g/dL (ref 31.5–35.7)
MCV: 88 fL (ref 79–97)
MONOS ABS: 0.2 10*3/uL (ref 0.1–0.9)
Monocytes: 5 %
NEUTROS ABS: 1.8 10*3/uL (ref 1.4–7.0)
Neutrophils: 39 %
PLATELETS: 203 10*3/uL (ref 150–379)
RBC: 5.06 x10E6/uL (ref 4.14–5.80)
RDW: 14 % (ref 12.3–15.4)
WBC: 4.6 10*3/uL (ref 3.4–10.8)

## 2016-06-06 LAB — CMP14+EGFR
A/G RATIO: 1.7 (ref 1.2–2.2)
ALK PHOS: 62 IU/L (ref 39–117)
ALT: 15 IU/L (ref 0–44)
AST: 16 IU/L (ref 0–40)
Albumin: 4.4 g/dL (ref 3.5–5.5)
BILIRUBIN TOTAL: 0.5 mg/dL (ref 0.0–1.2)
BUN / CREAT RATIO: 12 (ref 9–20)
BUN: 9 mg/dL (ref 6–24)
CO2: 25 mmol/L (ref 18–29)
Calcium: 9.3 mg/dL (ref 8.7–10.2)
Chloride: 102 mmol/L (ref 96–106)
Creatinine, Ser: 0.78 mg/dL (ref 0.76–1.27)
GFR calc Af Amer: 130 mL/min/{1.73_m2} (ref >59)
GFR calc non Af Amer: 112 mL/min/{1.73_m2} (ref >59)
GLUCOSE: 116 mg/dL — AB (ref 65–99)
Globulin, Total: 2.6 g/dL (ref 1.5–4.5)
POTASSIUM: 4.3 mmol/L (ref 3.5–5.2)
Sodium: 142 mmol/L (ref 134–144)
Total Protein: 7 g/dL (ref 6.0–8.5)

## 2016-06-06 LAB — LIPID PANEL
CHOLESTEROL TOTAL: 172 mg/dL (ref 100–199)
Chol/HDL Ratio: 4.9 ratio units (ref 0.0–5.0)
HDL: 35 mg/dL — AB (ref >39)
LDL Calculated: 113 mg/dL — ABNORMAL HIGH (ref 0–99)
Triglycerides: 122 mg/dL (ref 0–149)
VLDL CHOLESTEROL CAL: 24 mg/dL (ref 5–40)

## 2016-06-06 LAB — TSH: TSH: 2.84 u[IU]/mL (ref 0.450–4.500)

## 2016-06-12 ENCOUNTER — Telehealth: Payer: Self-pay | Admitting: Emergency Medicine

## 2016-06-12 NOTE — Telephone Encounter (Signed)
-----   Message from Magdalene RiverBrittany D Wiseman, PA-C sent at 06/10/2016  3:29 PM EDT ----- Please call pt and let him know his results show that he has clinically normal blood count, thyroid,  electrolytes, blood sugar, triglycerides,  liver enzymes and kidney function. His bad cholesterol (LDL) was slightly elevated at 113, normal is <100 but his total cholesterol is normal at 172 (normal is <200) so that is most likely because he was not fasting that day when he had his labs drawn. We typically like patients to fast 8 hours before they have their cholesterol checked. We will monitor this value at his next visit. Also his derm report showed that the skin lesion was in fact a benign skin tag. Please let me know if he has any questions, thanks!

## 2016-12-28 ENCOUNTER — Ambulatory Visit (INDEPENDENT_AMBULATORY_CARE_PROVIDER_SITE_OTHER): Payer: BLUE CROSS/BLUE SHIELD | Admitting: Physician Assistant

## 2016-12-28 ENCOUNTER — Encounter: Payer: Self-pay | Admitting: Physician Assistant

## 2016-12-28 VITALS — BP 112/66 | HR 71 | Temp 98.7°F | Resp 18 | Ht 64.76 in | Wt 143.0 lb

## 2016-12-28 DIAGNOSIS — L989 Disorder of the skin and subcutaneous tissue, unspecified: Secondary | ICD-10-CM | POA: Diagnosis not present

## 2016-12-28 DIAGNOSIS — R21 Rash and other nonspecific skin eruption: Secondary | ICD-10-CM | POA: Diagnosis not present

## 2016-12-28 LAB — POCT SKIN KOH: SKIN KOH, POC: NEGATIVE

## 2016-12-28 MED ORDER — KETOCONAZOLE 2 % EX CREA: 1.0000 "application " | TOPICAL_CREAM | Freq: Every day | CUTANEOUS | 0 refills | Status: AC

## 2016-12-28 NOTE — Progress Notes (Signed)
   Steven Campos  MRN: [id number] DOB: 14-Aug-1975  Subjective:   Steven Campos is a 41 y.o. male who presents for evaluation of a rash involving the groin. Rash started 3 months ago. Lesions are white, and raised in texture. Rash has changed over time. Rash is pruritic. Worsened with heat. Associated symptoms: none. Patient denies: abdominal pain, arthralgia, congestion, cough, crankiness, decrease in appetite, decrease in energy level, fever, headache, irritability, myalgia, nausea, sore throat and vomiting. Patient has not had contacts with similar rash. Patient has not had new exposures (soaps, lotions, laundry detergents, foods, medications, plants, insects or animals). Had same rash one year ago on his neck and it resolved with ketoconazole cream. Has not tried anything is this area.   Pt would also like a skin tag removed from his forehead. Notes it has been there for a few years. It has grown slightly in size. Denies pain, redness, and warmth.   Review of Systems  Constitutional: Negative for chills, diaphoresis and fever.  Gastrointestinal: Negative for nausea and vomiting.  Genitourinary: Negative for difficulty urinating, discharge, dysuria, frequency, genital sores, penile pain, penile swelling, scrotal swelling and testicular pain.    Patient Active Problem List   Diagnosis Date Noted  . Chest pain 09/04/2010    No current outpatient prescriptions on file prior to visit.   No current facility-administered medications on file prior to visit.     No Known Allergies   Objective:  BP 112/66 (BP Location: Left Arm, Patient Position: Sitting, Cuff Size: Normal)   Pulse 71   Temp 98.7 F (37.1 C) (Oral)   Resp 18   Ht 5' 4.76" (1.645 m)   Wt 143 lb (64.9 kg)   SpO2 98%   BMI 23.97 kg/m   Physical Exam  Constitutional: He is oriented to person, place, and time and well-developed, well-nourished, and in no distress.  HENT:  Head: Normocephalic and atraumatic.  Eyes:  Conjunctivae are normal.  Neck: Normal range of motion.  Pulmonary/Chest: Effort normal.  Neurological: He is alert and oriented to person, place, and time. Gait normal.  Skin: Skin is warm and dry.  Erythematous and scaly plaque noted in the groin, more noticeable in right groin region.  One small, soft, flesh-colored skin growth noted on forehead. See image below.   Psychiatric: Affect normal.  Vitals reviewed.      Results for orders placed or performed in visit on 12/28/16 (from the past 24 hour(s))  POCT Skin KOH     Status: None   Collection Time: 12/28/16  5:56 PM  Result Value Ref Range   Skin KOH, POC Negative Negative   Procedure:Skin tag removal Area prepped with alcohol pad. Lesion removed using iris scissors and forceps. Pressure applied. Silver nitrate stick used to stop any excess bleeding. Bandage applied. Lesion sent for pathology review.   Assessment and Plan :  1. Rash and nonspecific skin eruption Hx and PE findings consistent with tinea cruris. Will treat with topical azole cream. Pt encouraged to return/contact to clinic if symptoms worsen, do not improve in 2 weeks, or as needed.  - POCT Skin KOH - ketoconazole (NIZORAL) 2 % cream; Apply 1 application topically daily.  Dispense: 30 g; Refill: 0  2. Skin lesion Appearance consistent with skin tag but due to location, will send for pathology review.  - Dermatology pathology  Benjiman Core PA-C  Primary Care at [facility] 12/28/2016 6:33 PM

## 2016-12-28 NOTE — Progress Notes (Deleted)
Subjective:     Steven Campos is a 41 y.o. male who presents for evaluation of a rash involving the groin. Rash started 3 months ago. Lesions are white, and raised in texture. Rash has changed over time. Rash is pruritic. Worsened with heat. Associated symptoms: none. Patient denies: abdominal pain, arthralgia, congestion, cough, crankiness, decrease in appetite, decrease in energy level, fever, headache, irritability, myalgia, nausea, sore throat and vomiting. Patient has not had contacts with similar rash. Patient has not had new exposures (soaps, lotions, laundry detergents, foods, medications, plants, insects or animals). Had same rash one year ago on his neck and it resolved with ketoconazole cream.   {Common ambulatory SmartLinks:19316}  Review of Systems {ros; complete:30496}    Objective:    BP 112/66 (BP Location: Left Arm, Patient Position: Sitting, Cuff Size: Normal)   Pulse 71   Temp 98.7 F (37.1 C) (Oral)   Resp 18   Ht 5' 4.76" (1.645 m)   Wt 143 lb (64.9 kg)   SpO2 98%   BMI 23.97 kg/m  General:  {gen appearance:16600}  Skin:  {skin exam:30902::"normal"}     Assessment:    {derm diagnosis:16511}    Plan:    {ZOXW:96045}

## 2016-12-28 NOTE — Patient Instructions (Addendum)
Use cream daily to affected area for two weeks. If no improvement in 2 weeks, contact our office or return.   For skin tag, keep bandaid on until you get home. We have sent off a path report and will notify you with the results. Thank you for letting me participate in your health and well being.   Jock Itch Jock itch is an infection of the skin in the groin area. It is caused by a type of germ (fungus). The infection causes a rash and itching in the groin and upper thigh. It is common in people who play sports. Being in places with hot weather and wearing tight or wet clothes can increase the chance of getting jock itch. The rash usually goes away in 2-3 weeks with treatment. Follow these instructions at home:  Take medicines only as told by your doctor. Apply skin creams or ointments exactly as told.  Wear loose-fitting clothing. ? Men should wear cotton boxer shorts. ? Women should wear cotton underwear.  Change your underwear every day to keep your groin dry.  Avoid hot baths.  Dry your groin area well after you take a bath or shower. ? Use a separate towel to dry your groin area. This will help to prevent a spreading of the infection to other areas of your body.  Do not scratch the affected area.  Do not share towels with other people. Contact a doctor if:  Your rash does not improve or it gets worse after 2 weeks of treatment.  Your rash is spreading.  Your rash comes back after treatment is finished.  You have a fever.  You have redness, swelling, or pain in the area around your rash.  You have fluid, blood, or pus coming from your rash.  Your have your rash for more than 4 weeks. This information is not intended to replace advice given to you by your health care provider. Make sure you discuss any questions you have with your health care provider. Document Released: 06/10/2009 Document Revised: 08/22/2015 Document Reviewed: 12/26/2013 Elsevier Interactive Patient  Education  2018 [employer].  Skin Tag, Adult A skin tag (acrochordon) is a soft, extra growth of skin. Most skin tags are flesh-colored and rarely bigger than a pencil eraser. They commonly form near areas where there are folds in the skin, such as the armpit or groin. Skin tags are not dangerous, and they do not spread from person to person (are not contagious). You may have one skin tag or several. Skin tags do not require treatment. However, your health care provider may recommend removal of a skin tag if it:  Gets irritated from clothing.  Bleeds.  Is visible and unsightly.  Your health care provider can remove skin tags with a simple surgical procedure or a procedure that involves freezing the skin tag. Follow these instructions at home:  Watch for any changes in your skin tag. A normal skin tag does not require any other special care at home.  Take over-the-counter and prescription medicines only as told by your health care provider.  Keep all follow-up visits as told by your health care provider. This is important. Contact a health care provider if:  You have a skin tag that: ? Becomes painful. ? Changes color. ? Bleeds. ? Swells.  You develop more skin tags. This information is not intended to replace advice given to you by your health care provider. Make sure you discuss any questions you have with your health care provider.  Document Released: 03/31/2015 Document Revised: 11/10/2015 Document Reviewed: 03/31/2015 Elsevier Interactive Patient Education  2018 [employer].   IF you received an x-ray today, you will receive an invoice from [facility]. Please contact [facility] at [phone] with questions or concerns regarding your invoice.   IF you received labwork today, you will receive an invoice from [facility]. Please contact [facility] at [phone] with questions or concerns regarding your invoice.   Our billing staff will not be able to  assist you with questions regarding bills from these companies.  You will be contacted with the lab results as soon as they are available. The fastest way to get your results is to activate your My Chart account. Instructions are located on the last page of this paperwork. If you have not heard from [place] regarding the results in 2 weeks, please contact this office.

## 2017-05-18 ENCOUNTER — Other Ambulatory Visit: Payer: Self-pay

## 2017-05-18 ENCOUNTER — Encounter: Payer: Self-pay | Admitting: Family Medicine

## 2017-05-18 ENCOUNTER — Ambulatory Visit: Payer: BLUE CROSS/BLUE SHIELD | Admitting: Family Medicine

## 2017-05-18 VITALS — BP 110/82 | HR 87 | Temp 98.5°F | Ht 64.0 in | Wt 148.4 lb

## 2017-05-18 DIAGNOSIS — R079 Chest pain, unspecified: Secondary | ICD-10-CM

## 2017-05-18 MED ORDER — CYCLOBENZAPRINE HCL 10 MG PO TABS: 10.0000 mg | ORAL_TABLET | Freq: Three times a day (TID) | ORAL | 0 refills | Status: DC | PRN

## 2017-05-18 MED ORDER — IBUPROFEN 600 MG PO TABS: 600.0000 mg | ORAL_TABLET | Freq: Three times a day (TID) | ORAL | 0 refills | Status: DC | PRN

## 2017-05-18 NOTE — Patient Instructions (Signed)
     IF you received an x-ray today, you will receive an invoice from [facility]. Please contact [facility] at [phone] with questions or concerns regarding your invoice.   IF you received labwork today, you will receive an invoice from [facility]. Please contact [facility] at [phone] with questions or concerns regarding your invoice.   Our billing staff will not be able to assist you with questions regarding bills from these companies.  You will be contacted with the lab results as soon as they are available. The fastest way to get your results is to activate your My Chart account. Instructions are located on the last page of this paperwork. If you have not heard from us regarding the results in 2 weeks, please contact this office.     

## 2017-05-18 NOTE — Progress Notes (Signed)
   2/19/20195:35 PM  Debbra RidingFelix Pulice 12-01-1975, 42 y.o. male [id number]  Chief Complaint  Patient presents with  . Neck Pain    having pain in the right chest area.     HPI:   Patient is a 42 y.o. male who presents today for intermittent sharp right chest pain for the past several weeks. Reports comes randomly, not exacerbated by activity though he is very sedentary, last 3-5 minutes, self resolves, does not radiate, not associated with SOB, palpitations, diaphoresis, nausea, leg edema. Denies any reflux or cough. He states that chest pain feels better when he stretches, not worse with deep breathing. He has also noticed upper back and neck pain that radiates up his head, causing a headache at time. He feels these areas get hot when they are hurting. He denies any recent trauma or injuries or any inciting events. He does not smoke. He denies any fhx of CAD.  Depression screen [facility] 05/18/2017 12/28/2016 06/05/2016  Decreased Interest 0 0 0  Down, Depressed, Hopeless 0 0 0  PHQ - 2 Score 0 0 0    No Known Allergies  Prior to Admission medications   Not on File    Past Medical History:  Diagnosis Date  . Chest pain    with left shoulder pain    History reviewed. No pertinent surgical history.  Social History   Tobacco Use  . Smoking status: Never Smoker  . Smokeless tobacco: Never Used  Substance Use Topics  . Alcohol use: No    History reviewed. No pertinent family history.  ROS Per hpi  OBJECTIVE:  Blood pressure 110/82, pulse 87, temperature 98.5 F (36.9 C), temperature source Oral, height 5\' 4"  (1.626 m), weight 148 lb 6.4 oz (67.3 kg), SpO2 97 %.  Physical Exam  Constitutional: He is oriented to person, place, and time and well-developed, well-nourished, and in no distress.  HENT:  Head: Normocephalic and atraumatic.  Mouth/Throat: Oropharynx is clear and moist.  Eyes: EOM are normal. Pupils are equal, round, and reactive to light.  Neck: Normal range  of motion. Muscular tenderness present. No spinous process tenderness present.  Cardiovascular: Normal rate and regular rhythm. Exam reveals no gallop and no friction rub.  No murmur heard. Pulmonary/Chest: Effort normal and breath sounds normal. He has no wheezes. He has no rales. He exhibits no tenderness.  Neurological: He is alert and oriented to person, place, and time. Gait normal.  Skin: Skin is warm and dry.    EKG: nsr, HR 67, no ST changes  ASSESSMENT and PLAN  1. Chest pain, unspecified type - EKG 12-Lead Patient with normal vital signs, exam and ekg. Chest pain description non specific, no fhx CAD. Treating as MSK etiology, supportive measures, new meds r/se/b and ER precautions reviewed.   Other orders - cyclobenzaprine (FLEXERIL) 10 MG tablet; Take 1 tablet (10 mg total) by mouth 3 (three) times daily as needed for muscle spasms. - ibuprofen (ADVIL,MOTRIN) 600 MG tablet; Take 1 tablet (600 mg total) by mouth every 8 (eight) hours as needed.  Return if symptoms worsen or fail to improve.    Myles LippsIrma M Santiago, MD Primary Care at [facility] [address] [place], [place] [address] Ph.  [phone] Fax [phone]

## 2017-12-23 ENCOUNTER — Other Ambulatory Visit: Payer: Self-pay

## 2017-12-23 ENCOUNTER — Encounter: Payer: Self-pay | Admitting: Urgent Care

## 2017-12-23 ENCOUNTER — Ambulatory Visit: Payer: BLUE CROSS/BLUE SHIELD | Admitting: Urgent Care

## 2017-12-23 VITALS — BP 114/70 | HR 69 | Temp 98.3°F | Resp 18 | Ht 64.0 in | Wt 150.4 lb

## 2017-12-23 DIAGNOSIS — R059 Cough, unspecified: Secondary | ICD-10-CM

## 2017-12-23 DIAGNOSIS — R05 Cough: Secondary | ICD-10-CM

## 2017-12-23 DIAGNOSIS — J069 Acute upper respiratory infection, unspecified: Secondary | ICD-10-CM | POA: Diagnosis not present

## 2017-12-23 DIAGNOSIS — J9801 Acute bronchospasm: Secondary | ICD-10-CM

## 2017-12-23 MED ORDER — HYDROCODONE-HOMATROPINE 5-1.5 MG/5ML PO SYRP: 5.0000 mL | ORAL_SOLUTION | Freq: Every evening | ORAL | 0 refills | Status: DC | PRN

## 2017-12-23 MED ORDER — BENZONATATE 100 MG PO CAPS: 100.0000 mg | ORAL_CAPSULE | Freq: Three times a day (TID) | ORAL | 0 refills | Status: DC | PRN

## 2017-12-23 MED ORDER — CETIRIZINE HCL 10 MG PO TABS: 10.0000 mg | ORAL_TABLET | Freq: Every day | ORAL | 11 refills | Status: DC

## 2017-12-23 MED ORDER — PREDNISONE 20 MG PO TABS: ORAL_TABLET | ORAL | 0 refills | Status: DC

## 2017-12-23 NOTE — Progress Notes (Signed)
   MRN: [id number] DOB: Jul 25, 1975  Subjective:   Kaushal Vannice is a 42 y.o. male presenting for 1 week history of dry cough, persistent coughing fits, chest congestion. Denies fever, chest pain, n/v. Cough is causing belly discomfort. Has tried natural remedies only. Denies smoking cigarettes. Denies history of allergies. Denies history of allergies, asthma.  Also reports having full body itching that sometimes extends into his bedtime after showering and using his towel.  He states that he has 3 towels total and uses one towel at a time for 2 to 4 weeks without washing it.  Sometimes he goes longer.  Denies facial swelling, oral swelling, rashes.  Kristofer has a current medication list which includes the following prescription(s): cyclobenzaprine and ibuprofen. Also has No Known Allergies.  Tyheem  has a past medical history of Chest pain. Denies past surgical history.   Objective:   Vitals: BP 114/70   Pulse 69   Temp 98.3 F (36.8 C) (Oral)   Resp 18   Ht 5\' 4"  (1.626 m)   Wt 150 lb 6.4 oz (68.2 kg)   SpO2 98%   BMI 25.82 kg/m   Physical Exam  Constitutional: He is oriented to person, place, and time. He appears well-developed and well-nourished.  HENT:  Mouth/Throat: Oropharynx is clear and moist.  Eyes: Right eye exhibits no discharge. Left eye exhibits no discharge. No scleral icterus.  Cardiovascular: Normal rate, regular rhythm, normal heart sounds and intact distal pulses. Exam reveals no gallop and no friction rub.  No murmur heard. Pulmonary/Chest: Effort normal and breath sounds normal. No stridor. No respiratory distress. He has no wheezes. He has no rales.  Neurological: He is alert and oriented to person, place, and time.   Assessment and Plan :   Viral URI  Cough  Bronchospasm  Likely viral in etiology d/t reassuring physical exam findings.  Patient's cough is very bothersome to him so I offered strong cough suppression medicines in addition to short steroid  course.  He was agreeable to holding off on a chest x-ray but if he has no improvement as discussed in clinic he will return to clinic.  Counseled patient on potential for adverse effects with medications prescribed today, patient verbalized understanding.  Return-to-clinic precautions discussed, patient verbalized understanding.  Counseled patient on appropriate use of towels and hygiene.  For his itching he is going to use Zyrtec but also change the way that he uses towels including washing them more frequently.   Wallis Bamberg, PA-C Primary Care at [facility] [phone] 12/23/2017  2:36 PM

## 2017-12-23 NOTE — Patient Instructions (Addendum)
Viral Respiratory Infection A respiratory infection is an illness that affects part of the respiratory system, such as the lungs, nose, or throat. Most respiratory infections are caused by either viruses or bacteria. A respiratory infection that is caused by a virus is called a viral respiratory infection. Common types of viral respiratory infections include:  A cold.  The flu (influenza).  A respiratory syncytial virus (RSV) infection.  How do I know if I have a viral respiratory infection? Most viral respiratory infections cause:  A stuffy or runny nose.  Yellow or green nasal discharge.  A cough.  Sneezing.  Fatigue.  Achy muscles.  A sore throat.  Sweating or chills.  A fever.  A headache.  How are viral respiratory infections treated? If influenza is diagnosed early, it may be treated with an antiviral medicine that shortens the length of time a person has symptoms. Symptoms of viral respiratory infections may be treated with over-the-counter and prescription medicines, such as:  Expectorants. These make it easier to cough up mucus.  Decongestant nasal sprays.  Health care providers do not prescribe antibiotic medicines for viral infections. This is because antibiotics are designed to kill bacteria. They have no effect on viruses. How do I know if I should stay home from work or school? To avoid exposing others to your respiratory infection, stay home if you have:  A fever.  A persistent cough.  A sore throat.  A runny nose.  Sneezing.  Muscles aches.  Headaches.  Fatigue.  Weakness.  Chills.  Sweating.  Nausea.  Follow these instructions at home:  Rest as much as possible.  Take over-the-counter and prescription medicines only as told by your health care provider.  Drink enough fluid to keep your urine clear or pale yellow. This helps prevent dehydration and helps loosen up mucus.  Gargle with a salt-water mixture 3-4 times per day or  as needed. To make a salt-water mixture, completely dissolve -1 tsp of salt in 1 cup of warm water.  Use nose drops made from salt water to ease congestion and soften raw skin around your nose.  Do not drink alcohol.  Do not use tobacco products, including cigarettes, chewing tobacco, and e-cigarettes. If you need help quitting, ask your health care provider. Contact a health care provider if:  Your symptoms last for 10 days or longer.  Your symptoms get worse over time.  You have a fever.  You have severe sinus pain in your face or forehead.  The glands in your jaw or neck become very swollen. Get help right away if:  You feel pain or pressure in your chest.  You have shortness of breath.  You faint or feel like you will faint.  You have severe and persistent vomiting.  You feel confused or disoriented. This information is not intended to replace advice given to you by your health care provider. Make sure you discuss any questions you have with your health care provider. Document Released: 12/24/2004 Document Revised: 08/22/2015 Document Reviewed: 08/22/2014 Elsevier Interactive Patient Education  2018 Elsevier Inc.     If you have lab work done today you will be contacted with your lab results within the next 2 weeks.  If you have not heard from us then please contact us. The fastest way to get your results is to register for My Chart.   IF you received an x-ray today, you will receive an invoice from [facility]. Please contact  Radiology at [phone] with questions or   concerns regarding your invoice.   IF you received labwork today, you will receive an invoice from [facility]. Please contact [facility] at [phone] with questions or concerns regarding your invoice.   Our billing staff will not be able to assist you with questions regarding bills from these companies.  You will be contacted with the lab results as soon as they are available. The  fastest way to get your results is to activate your My Chart account. Instructions are located on the last page of this paperwork. If you have not heard from us regarding the results in 2 weeks, please contact this office.      

## 2020-09-15 NOTE — Progress Notes (Signed)
    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 Areas of scaling on his feet as well.  PERTINENT  PMH / PSH: none  OBJECTIVE:   Ht 5\' 4"  (1.626 m)   Wt 159 lb (72.1 kg)   BMI 27.29 kg/m   General: Overweight male, NAD CV: RRR, no murmurs Pulm: CTAB, no wheezes or rales Abdomen: soft, non-tender, +BS Derm: Extensive scaling of bilateral feet worse on left  ASSESSMENT/PLAN:   Scaling of bilateral feet Clinically appears like tinea pedis. Patient to schedule appointment for KOH scraping and will initiate treatment based on results.   HCM - Covid vaccine booster due, will plan to get at pharmacy to get Moderna - HIV screening ordered - HCV screening ordered - Tdap given today - Colonoscopy due, patient amenable. amb ref GI - screening lipid panel and A1c ordered  , MD [facility]

## 2020-09-15 NOTE — Patient Instructions (Addendum)
It was nice seeing you today!  Schedule a separate appointment for your foot.  They will call you to schedule an appointment regarding the colonoscopy.  Recommend getting the Covid vaccine booster at your pharmacy.  Next physical in 1 year. Schedule sooner if any other concerns.  Please arrive at least 15 minutes prior to your scheduled appointments.  Stay well, Littie Deeds, MD [facility] [phone]

## 2020-09-17 ENCOUNTER — Ambulatory Visit (INDEPENDENT_AMBULATORY_CARE_PROVIDER_SITE_OTHER): Payer: 59 | Admitting: Family Medicine

## 2020-09-17 ENCOUNTER — Other Ambulatory Visit: Payer: Self-pay

## 2020-09-17 ENCOUNTER — Encounter: Payer: Self-pay | Admitting: Family Medicine

## 2020-09-17 VITALS — BP 104/68 | HR 66 | Ht 64.0 in | Wt 159.0 lb

## 2020-09-17 DIAGNOSIS — Z7689 Persons encountering health services in other specified circumstances: Secondary | ICD-10-CM | POA: Diagnosis not present

## 2020-09-17 DIAGNOSIS — Z114 Encounter for screening for human immunodeficiency virus [HIV]: Secondary | ICD-10-CM

## 2020-09-17 DIAGNOSIS — Z13220 Encounter for screening for lipoid disorders: Secondary | ICD-10-CM

## 2020-09-17 DIAGNOSIS — Z1211 Encounter for screening for malignant neoplasm of colon: Secondary | ICD-10-CM

## 2020-09-17 DIAGNOSIS — E663 Overweight: Secondary | ICD-10-CM

## 2020-09-17 DIAGNOSIS — Z1159 Encounter for screening for other viral diseases: Secondary | ICD-10-CM | POA: Diagnosis not present

## 2020-09-17 DIAGNOSIS — Z23 Encounter for immunization: Secondary | ICD-10-CM

## 2020-09-17 DIAGNOSIS — R739 Hyperglycemia, unspecified: Secondary | ICD-10-CM

## 2020-09-18 LAB — HCV INTERPRETATION

## 2020-09-18 LAB — LIPID PANEL
Chol/HDL Ratio: 5.7 ratio — ABNORMAL HIGH (ref 0.0–5.0)
Cholesterol, Total: 199 mg/dL (ref 100–199)
HDL: 35 mg/dL — ABNORMAL LOW (ref >39)
LDL Chol Calc (NIH): 138 mg/dL — ABNORMAL HIGH (ref 0–99)
Triglycerides: 146 mg/dL (ref 0–149)
VLDL Cholesterol Cal: 26 mg/dL (ref 5–40)

## 2020-09-18 LAB — HCV AB W REFLEX TO QUANT PCR: HCV Ab: <0.1 s/co ratio (ref 0.0–0.9)

## 2020-09-18 LAB — HIV ANTIBODY (ROUTINE TESTING W REFLEX): HIV Screen 4th Generation wRfx: NONREACTIVE

## 2020-09-18 LAB — HEMOGLOBIN A1C
Est. average glucose Bld gHb Est-mCnc: 117 mg/dL
Hgb A1c MFr Bld: 5.7 % — ABNORMAL HIGH (ref 4.8–5.6)

## 2020-09-23 NOTE — Patient Instructions (Addendum)
It was nice seeing you today!  Apply cream twice a day for at least 4 weeks until your foot has cleared up for at least 1 week.  Come back and see me if this has not improved in 6 weeks.  I recommend going to the eye doctor for a vision exam.  Please arrive at least 15 minutes prior to your scheduled appointments.  Stay well, Steven Deeds, MD [facility] [phone]

## 2020-09-23 NOTE — Progress Notes (Signed)
    SUBJECTIVE:   CHIEF COMPLAINT / HPI:   Patient was recently seen last week to establish care and for physical. Labs obtained notable for A1c 5.7, mildly elevated LDL, and negative HIV and HCV screening. Discussed lab results with patient and recommended lifestyle changes.  Foot pruritis Patient reports intermittent itching and scaling in both feet for the past 5-6 months.  Vision concern Reports some difficulty with vision at night but reports no issues with vision during the day. Has not seen an eye doctor recently.  PERTINENT  PMH / PSH: prediabetes  OBJECTIVE:   BP 102/60   Pulse 64   Ht 5\' 4"  (1.626 m)   Wt 159 lb (72.1 kg)   SpO2 98%   BMI 27.29 kg/m   General: overweight male, NAD Derm: Extensive scaling of bilateral feet worse on left  ASSESSMENT/PLAN:   Prediabetes A1c borderline 5.7. Lifestyle changes discussed, recommended weight loss   Rash on foot With extensive scaling. KOH prep negative but clinically appears like tinea pedis. - topical terbinafine BID x 4 weeks  HCM - due to colonoscopy, referral placed at last visit - recommended seeing optometrist for eye exam  , MD [facility]

## 2020-09-26 ENCOUNTER — Other Ambulatory Visit: Payer: Self-pay

## 2020-09-26 ENCOUNTER — Encounter: Payer: Self-pay | Admitting: Family Medicine

## 2020-09-26 ENCOUNTER — Ambulatory Visit (INDEPENDENT_AMBULATORY_CARE_PROVIDER_SITE_OTHER): Payer: 59 | Admitting: Family Medicine

## 2020-09-26 VITALS — BP 102/60 | HR 64 | Ht 64.0 in | Wt 159.0 lb

## 2020-09-26 DIAGNOSIS — R7303 Prediabetes: Secondary | ICD-10-CM

## 2020-09-26 DIAGNOSIS — R21 Rash and other nonspecific skin eruption: Secondary | ICD-10-CM

## 2020-09-26 LAB — POCT SKIN KOH: Skin KOH, POC: NEGATIVE

## 2020-09-26 MED ORDER — TERBINAFINE HCL 1 % EX CREA: 1.0000 "application " | TOPICAL_CREAM | Freq: Two times a day (BID) | CUTANEOUS | 0 refills | Status: DC

## 2020-09-26 NOTE — Assessment & Plan Note (Signed)
A1c borderline 5.7. Lifestyle changes discussed, recommended weight loss

## 2020-12-04 ENCOUNTER — Ambulatory Visit: Payer: 59 | Admitting: Family Medicine

## 2020-12-26 NOTE — Progress Notes (Deleted)
    SUBJECTIVE:   CHIEF COMPLAINT / HPI: Neck pain  ***  PERTINENT  PMH / PSH: ***  OBJECTIVE:   There were no vitals taken for this visit.  General: ***, NAD CV: RRR, no murmurs*** Pulm: CTAB, no wheezes or rales  ASSESSMENT/PLAN:   No problem-specific Assessment & Plan notes found for this encounter.     Littie Deeds, MD [facility]   {    This will disappear when note is signed, click to select method of visit    :1}

## 2020-12-27 ENCOUNTER — Ambulatory Visit: Payer: 59 | Admitting: Family Medicine

## 2021-01-17 ENCOUNTER — Ambulatory Visit: Payer: 59 | Admitting: Family Medicine

## 2021-01-17 NOTE — Progress Notes (Deleted)
    SUBJECTIVE:   CHIEF COMPLAINT / HPI: neck pain, eye doctor referral  ***  PERTINENT  PMH / PSH: prediabetes  OBJECTIVE:   There were no vitals taken for this visit.  General: ***, NAD CV: RRR, no murmurs*** Pulm: CTAB, no wheezes or rales  ASSESSMENT/PLAN:   No problem-specific Assessment & Plan notes found for this encounter.     Littie Deeds, MD [facility]   {    This will disappear when note is signed, click to select method of visit    :1}

## 2021-06-18 ENCOUNTER — Ambulatory Visit (INDEPENDENT_AMBULATORY_CARE_PROVIDER_SITE_OTHER): Payer: Self-pay | Admitting: Family Medicine

## 2021-06-18 ENCOUNTER — Other Ambulatory Visit: Payer: Self-pay

## 2021-06-18 ENCOUNTER — Encounter: Payer: Self-pay | Admitting: Family Medicine

## 2021-06-18 VITALS — BP 112/75 | HR 81 | Ht 64.0 in | Wt 162.5 lb

## 2021-06-18 DIAGNOSIS — K5900 Constipation, unspecified: Secondary | ICD-10-CM

## 2021-06-18 DIAGNOSIS — R0789 Other chest pain: Secondary | ICD-10-CM

## 2021-06-18 NOTE — Patient Instructions (Signed)
If you have any further bouts of chest pain please let us know. ? ?For your constipation I recommend starting with MiraLAX which you can get over-the-counter.  As we discussed I would start with 1 scoop in the morning mixed in fluid, give 3 days to see if this takes effect and then increase to 1 in the morning and 1 in the evening, give 3 days to see if this takes effect and then increase to 2 in the morning 2 in the evening.  Find a dose that causes you to have a soft bowel movement each day.  If you have further questions or concerns please let us know.  Note that you are recommended to have a colonoscopy at age 10 or older.  Let me know if you would like to have the scheduled. ?

## 2021-06-18 NOTE — Progress Notes (Signed)
? ? ?SUBJECTIVE:  ? ?CHIEF COMPLAINT / HPI:  ? ?Chest pain: ?Started two nights ago when sleeping. He denies chest pain currently. Says it lasted 1-2 hours when it started. It woke him from sleep. It felt like a burning pain moving to the right side of his chest. Denies history of reflux. He states he did have chinese food Monday evening which is unusual for him. No alcohol. He denies shortness of breath, nausea, or vomiting but states he was sweating when it occurred. He denies chest pain outside of that one instances.  He states when he had the chest pain he felt like moving may have made it better and putting pressure on his chest may have made it feel better. ? ?Constipation: ?Patient states that he does have a bowel movement each day but that he finds it is hard to pass the stool and he sometimes feels like he cannot pass all the stool.  Denies abdominal pains today.  He has not tried any medicines for this. ? ?PERTINENT  PMH / PSH: Prediabetes ? ?OBJECTIVE:  ? ?BP 112/75   Pulse 81   Ht 5\' 4"  (1.626 m)   Wt 162 lb 8 oz (73.7 kg)   SpO2 98%   BMI 27.89 kg/m?   ? ?General: NAD, pleasant, able to participate in exam ?Cardiac: RRR, no murmurs.  No pain with palpation of the anterior chest wall. ?Respiratory: CTAB, normal effort, No wheezes, rales or rhonchi ?Abdomen: Bowel sounds present, nontender ?Skin: warm and dry, no rashes noted ?Neuro: alert, no obvious focal deficits ?Psych: Normal affect and mood ? ?ASSESSMENT/PLAN:  ?  ?Chest pain: ?This was an isolated bout that woke the patient from sleep on Monday, 2 nights prior.  He states that he did have an abnormal meal that evening as he went out with his son to eat [place] food which is not normal for him.  He denies a history of reflux.  He states that he had a burning sensation that woke him up from sleep.  He states that this lasted for at least an hour or so and that he was sweating at the time.  He denies any nausea at the time or shortness of  breath.  He does have a history of prediabetes that puts him at a slightly higher risk for cardiovascular etiology.  He is not had any chest pain since then.  He does not do any regular activity or climb stairs as a normal part of his day to elicit if this causes chest pain.  We will check an EKG to look for any signs of sequela from a cardiovascular event from Monday but I am less suspicious of this.  His symptoms sound more consistent with GERD given the history.  EKG shows no Q waves or other sequela of previous myocardial injury.  Discussed follow-up/return precautions if he develops any further bouts of chest pain but this suggests most likely an isolated episode of reflux given the history. ? ?Constipation: ?Patient states that he has bowel movement every day but that he finds the stool is hard to pass.  He does not take anything for this.  Has not tried anything for this.  He does not have any abdominal pain on physical exam today.  Discussed with him a trial of MiraLAX.  I discussed how to titrate this dosing.  He is recommended for colonoscopy being older than age 42 and I recommended he schedule this at some point.  I informed him  we can order this anytime that he would like.  Follow-up as needed ? ?Lurline Del, DO ?[facility]  ? ? ? ?

## 2021-08-05 NOTE — Progress Notes (Deleted)
    SUBJECTIVE:   CHIEF COMPLAINT / HPI:   Steven Campos is a 46 y.o. male who presents to the [facility] clinic today accompanied by *** to discuss concerns below.  Itching   Per chart review- dx with tinea pedis in June 2022 and prescribed topical terbinafine BID x4 weeks. KOH prep negative at that time but treated given clinical suspicion.    Swelling   PERTINENT  PMH / PSH: Prediabetes   OBJECTIVE:   There were no vitals taken for this visit. ***  General: NAD, pleasant, able to participate in exam Cardiac: RRR, no murmurs. Respiratory: CTAB, normal effort, No wheezes, rales or rhonchi Abdomen: Bowel sounds present, nontender, nondistended, no hepatosplenomegaly. Extremities: no edema or cyanosis. Skin: warm and dry, no rashes noted Neuro: alert, no obvious focal deficits Psych: Normal affect and mood  ASSESSMENT/PLAN:   No problem-specific Assessment & Plan notes found for this encounter.     Sabino Dick, DO [facility]

## 2021-08-06 ENCOUNTER — Ambulatory Visit: Payer: Self-pay

## 2021-08-06 ENCOUNTER — Encounter: Payer: Self-pay | Admitting: Family Medicine

## 2021-08-06 ENCOUNTER — Ambulatory Visit (INDEPENDENT_AMBULATORY_CARE_PROVIDER_SITE_OTHER): Payer: Commercial Managed Care - HMO | Admitting: Family Medicine

## 2021-08-06 ENCOUNTER — Ambulatory Visit
Admission: RE | Admit: 2021-08-06 | Discharge: 2021-08-06 | Disposition: A | Discharge status: Home / Self Care | Payer: Commercial Managed Care - HMO | Source: Ambulatory Visit | Attending: Family Medicine | Admitting: Family Medicine

## 2021-08-06 VITALS — BP 105/69 | HR 70 | Ht 64.0 in

## 2021-08-06 DIAGNOSIS — L282 Other prurigo: Secondary | ICD-10-CM

## 2021-08-06 DIAGNOSIS — M545 Low back pain, unspecified: Secondary | ICD-10-CM | POA: Diagnosis not present

## 2021-08-06 MED ORDER — CETIRIZINE HCL 10 MG PO TABS: 10.0000 mg | ORAL_TABLET | Freq: Every day | ORAL | 11 refills | Status: DC

## 2021-08-06 MED ORDER — CYCLOBENZAPRINE HCL 10 MG PO TABS: 10.0000 mg | ORAL_TABLET | Freq: Three times a day (TID) | ORAL | 0 refills | Status: AC | PRN

## 2021-08-06 NOTE — Patient Instructions (Addendum)
I think that the symptoms you are having are related to a back muscle spasm.  I am going to send in a medication called Flexeril which she can take several times a day.  This can make you very sleepy so please make sure not to drive a car right after taking or operate heavy machinery. ? ?I am going to get x-rays of your lower back just to make sure everything is fine.  You can go to [place] [facility] at [employer]. [address] ? ? ?For the rash, I think that you are having what we call "hives".  One of the medications that can help with this is an antiallergy medication called Zyrtec.  I am sending in a prescription, it is also available over-the-counter.  If you start to have worsening of the rash or concern you can use over-the-counter Benadryl, this can make you sleepy so it is probably better to taking it if you need it. ? ? ? ?For the next 1 to 2 weeks if there is no improvement in your symptoms. ?

## 2021-08-06 NOTE — Progress Notes (Signed)
? ? ?  SUBJECTIVE:  ? ?CHIEF COMPLAINT / HPI:  ? ?Pruritic rash ?Patient reports that starting Saturday evening he began having a pruritic rash that had red spots popping up on his body in different areas, the red spots went away after a few hours.  Initially started on just one of his arms and then spread to the rest of his body.  He has not had any more outbreaks of this rash since Monday.  The only thing he used was ibuprofen for this. ? ?Low back pain ?Patient reports that this morning when he was taking his daughter to school, when he was turning to start walking he had significant back pain that caused him to have limited ability to ambulate.  He denies any radiation down to his legs, it is just in his lower back.  He has never had this before.  Denies any urinary or bowel incontinence. ? ?PERTINENT  PMH / PSH: Reviewed ? ?OBJECTIVE:  ? ?BP 105/69   Pulse 70   Ht 5\' 4"  (1.626 m)   SpO2 98%   BMI 27.89 kg/m?   ?General: NAD, well-appearing, well-nourished ?Respiratory: No respiratory distress, breathing comfortably, able to speak in full sentences ?Skin: warm and dry, no rashes noted on exposed skin ?Psych: Appropriate affect and mood ?Lumbar spine: ?- Inspection: no gross deformity or asymmetry, swelling or ecchymosis ?- Palpation: TTP over the lumbar spinous processes and paraspinal muscles and SI joint (R>L)  ?- ROM: Mildly decreased ROM of the low back in flexion extension secondary to pain ?- Strength: 5/5 strength of lower extremity in L4-S1 nerve root distributions b/l; normal gait ?- Neuro: sensation intact in the L4-S1 nerve root distribution b/l, 2+ L4 and S1 reflexes ?- Special testing: Negative straight leg raise ? ? ?ASSESSMENT/PLAN:  ? ?Acute low back pain ?Patient does have acute low back pain with onset of several hours ago.  Neurologically intact but there is some concern given point tenderness on spine as well.  Most likely is a lumbar sprain that is musculoskeletal in nature but will  obtain x-rays to rule out bony abnormality. ?- Flexeril 10 mg 3 times daily as needed ?- Handout on back injury prevention given ?- Strict return and ER precautions given ?- Follow-up in the next 2 weeks if no improvement ? ?Pruritic rash, concern for idiopathic hives ?Description of the rash provided by patient seems more consistent with a hives picture.  Patient is not currently symptomatic.  We will treat as if it is related to an idiopathic hives and will give an antihistamine daily. ?- Zyrtec 10 mg daily ?- If persistent, reevaluate and consider need for Atarax versus Benadryl ?- As needed Benadryl at night if recurrent for patient ? ? ?Kijana Estock, DO ?[facility]  ?

## 2021-08-12 ENCOUNTER — Telehealth: Payer: Self-pay | Admitting: Family Medicine

## 2021-08-12 NOTE — Telephone Encounter (Signed)
Called patient with x-ray results. All questions were answered. Do not think it was contributing to patient's acute low back pain.  ? ? ?Steven Campos m ?

## 2021-11-05 ENCOUNTER — Ambulatory Visit (INDEPENDENT_AMBULATORY_CARE_PROVIDER_SITE_OTHER): Payer: Commercial Managed Care - HMO | Admitting: Family Medicine

## 2021-11-05 VITALS — BP 102/64 | HR 70 | Wt 163.0 lb

## 2021-11-05 DIAGNOSIS — M436 Torticollis: Secondary | ICD-10-CM | POA: Diagnosis not present

## 2021-11-05 MED ORDER — BACLOFEN 10 MG PO TABS: 10.0000 mg | ORAL_TABLET | Freq: Three times a day (TID) | ORAL | 0 refills | Status: DC

## 2021-11-05 NOTE — Patient Instructions (Addendum)
I am sending in a prescription for a muscle relaxer. Do not take any muscle relaxer more than 3 times per day. They can make you sleepy and impair your driving ability.   If you have worsening pain, please make sure to come back for evaluation

## 2021-11-05 NOTE — Progress Notes (Signed)
    SUBJECTIVE:   CHIEF COMPLAINT / HPI:   Neck pain - Has been going on for about 2 weeks - Noted it when he was going to sleep 1 night, when he turned his head he felt a pain in his neck - Has become more persistent for the last 2 days - Ibuprofen with only mild improvement - Tried Vicks vapor rub without significant change - Minimal improvement with stretching and massaging - Patient works as a Sports coach - No known trauma   PERTINENT  PMH / PSH: Reviewed  OBJECTIVE:   BP 102/64   Pulse 70   Wt 163 lb (73.9 kg)   SpO2 98%   BMI 27.98 kg/m   General: NAD, well-appearing, well-nourished Respiratory: No respiratory distress, breathing comfortably, able to speak in full sentences Skin: warm and dry, no rashes noted on exposed skin Psych: Appropriate affect and mood MSK: Neck/Back: - Inspection: no gross deformity or asymmetry, swelling or ecchymosis - Palpation: TTP over the neck spinal muscles and SCM insertion points, trapezius nontender to palpation - ROM: Full active ROM of the neck.  Mild pain with turning head to the left (pain located in the posterior right region of the neck).  Pain improved after addition of right sidebending - Strength: 5/5 wrist flexion, extension, biceps flexion. 4/5 triceps extension.  - Neuro: sensation intact in the C5-C8 nerve root distribution b/l, no weakness on examination   ASSESSMENT/PLAN:   Neck pain/torticollis Physical examination consistent with torticollis.  Likely the patient take muscle whenever he was turning in bed.  Also has some contribution due to poor posture with sitting at computer and using phone.  No signs of neurologic concerns or impingement in the cervical spine at this time. - Baclofen 10 mg 3 times daily as needed - Handout given on protocols - Recommend ice and stretches as able - Return precautions given  Evelena Leyden, DO [facility]

## 2022-03-20 ENCOUNTER — Ambulatory Visit: Payer: Commercial Managed Care - HMO | Admitting: Family Medicine

## 2022-03-20 NOTE — Patient Instructions (Incomplete)
It was nice seeing you today!  Blood work today.  See me in 3 months or whenever is a good for you.  Stay well, Karan Ramnauth, MD [facility] [phone]  --  Make sure to check out at the front desk before you leave today.  Please arrive at least 15 minutes prior to your scheduled appointments.  If you had blood work today, I will send you a MyChart message or a letter if results are normal. Otherwise, I will give you a call.  If you had a referral placed, they will call you to set up an appointment. Please give us a call if you don't hear back in the next 2 weeks.  If you need additional refills before your next appointment, please call your pharmacy first.  

## 2022-03-20 NOTE — Progress Notes (Deleted)
    SUBJECTIVE:   CHIEF COMPLAINT / HPI:  No chief complaint on file.   ***  PERTINENT  PMH / PSH: ***  Patient Care Team: Littie Deeds, MD as PCP - General (Family Medicine)   OBJECTIVE:   There were no vitals taken for this visit.  Physical Exam      11/05/2021   10:34 AM  Depression screen PHQ 2/9  Decreased Interest 0  Down, Depressed, Hopeless 0  PHQ - 2 Score 0  Altered sleeping 2  Tired, decreased energy 2  Change in appetite 0  Feeling bad or failure about yourself  0  Trouble concentrating 0  Moving slowly or fidgety/restless 0  Suicidal thoughts 0  PHQ-9 Score 4     {Show previous vital signs (optional):23777}  {Labs  Heme  Chem  Endocrine  Serology  Results Review (optional):23779}  ASSESSMENT/PLAN:   No problem-specific Assessment & Plan notes found for this encounter.    No follow-ups on file.   Littie Deeds, MD [facility]

## 2022-07-06 NOTE — Progress Notes (Signed)
    SUBJECTIVE:   CHIEF COMPLAINT / HPI:   Steven Campos is a 47 y.o. male who presents to the [facility] clinic today to discuss the following concerns:   Rash on Neck Really itchy around his neck, worse during warmer temperatures. Symptoms have been intermittent for many years. Has not tried any topical medications for this recently. In the past he was prescribed lamisil cream for his groin- isn't sure if this helped or not. He felt like the cream was making it burn. Right now it is not itchy.   Denies polyuria or polydipsia.   Chest Discomfort Ongoing for 1 month but intermittent. Feels like a burning sensation to central chest. Happens before he eats. Eating seems to help. No SOB, nausea, vomiting. Doesn't eat a lot of spicy foods. Does eat late at night. Eats right before bed. Not exercising. Not drinking alcohol. Drinks coffee sometimes. Hasn't tried anything for the pain. Lasts only about 1-3 minutes. No pain with walking.   PERTINENT  PMH / PSH: Prediabetes  OBJECTIVE:   BP 110/73   Pulse 87   Ht 5\' 5"  (1.651 m)   Wt 160 lb 3.2 oz (72.7 kg)   SpO2 100%   BMI 26.66 kg/m    General: NAD, pleasant, able to participate in exam Cardiac: RRR, no murmurs.  Chest wall: No discomfort to palpation of chest wall  Respiratory: CTAB, normal effort, No wheezes, rales or rhonchi Abdomen: Obese, soft, bowel sounds present, non-tender in all quadrants, non-distended Extremities: no edema or cyanosis. Skin: Acanthosis nigricans around neck. To left- posterior neck and right side of neck he has hyperpigmented lesions with irregular borders and some dry scales. Hyperpigmentation to b/l inguinal/groins with dry scales. Steven Campos, Medical Student present as chaperone Psych: Normal affect and mood         ASSESSMENT/PLAN:   1. Prediabetes Continues to be in pre-diabetic range.  - HgB A1c - Counseled on lifestyle modifications to help prevent progression to diabetes   2. Colon  cancer screening Overdue for colon cancer screening.  Amenable to referral today. - Ambulatory referral to Gastroenterology  3. Tinea corporis KOH confirms yeast. Treat topically.  Reports feeling burning sensation with Lamisil in the past, will try nystatin. - POCT Skin KOH - nystatin ointment (MYCOSTATIN); Apply 1 Application topically 2 (two) times daily.  Dispense: 30 g; Refill: 0 - Keep area clean and dry as best as possible  4. Epigastric pain His pain is actually more epigastric when further discussing.  Concern for reflux, though atypical that he is having pain prior to eating.  Could also have an ulcer if pain is improved with eating.  Will treat with PPI x 6 weeks.  Very low suspicion for cardiac pathology at this time. - pantoprazole (PROTONIX) 40 MG tablet; Take 1 tablet (40 mg total) by mouth daily.  Dispense: 42 tablet; Refill: 3 - Return precautions discussed     Steven Dick, DO [facility]

## 2022-07-07 ENCOUNTER — Other Ambulatory Visit: Payer: Self-pay

## 2022-07-07 ENCOUNTER — Ambulatory Visit (INDEPENDENT_AMBULATORY_CARE_PROVIDER_SITE_OTHER): Payer: Commercial Managed Care - HMO | Admitting: Family Medicine

## 2022-07-07 ENCOUNTER — Encounter: Payer: Self-pay | Admitting: Family Medicine

## 2022-07-07 VITALS — BP 110/73 | HR 87 | Ht 65.0 in | Wt 160.2 lb

## 2022-07-07 DIAGNOSIS — R1013 Epigastric pain: Secondary | ICD-10-CM | POA: Diagnosis not present

## 2022-07-07 DIAGNOSIS — Z1211 Encounter for screening for malignant neoplasm of colon: Secondary | ICD-10-CM

## 2022-07-07 DIAGNOSIS — B354 Tinea corporis: Secondary | ICD-10-CM

## 2022-07-07 DIAGNOSIS — R7303 Prediabetes: Secondary | ICD-10-CM

## 2022-07-07 LAB — POCT SKIN KOH: Skin KOH, POC: POSITIVE — AB

## 2022-07-07 LAB — POCT GLYCOSYLATED HEMOGLOBIN (HGB A1C): HbA1c, POC (prediabetic range): 5.9 % (ref 5.7–6.4)

## 2022-07-07 MED ORDER — PANTOPRAZOLE SODIUM 40 MG PO TBEC: 40.0000 mg | DELAYED_RELEASE_TABLET | Freq: Every day | ORAL | 3 refills | Status: DC

## 2022-07-07 MED ORDER — NYSTATIN 100000 UNIT/GM EX OINT: 1.0000 | TOPICAL_OINTMENT | Freq: Two times a day (BID) | CUTANEOUS | 0 refills | Status: DC

## 2022-07-07 NOTE — Patient Instructions (Signed)
It was wonderful to see you today.  Please bring ALL of your medications with you to every visit.   Today we talked about:  I am sending an antifungal ointment for your neck and groin. Use twice a day until resolved. Try to keep these areas dry. The testing did confirm this was a yeast infection.  For your chest discomfort, I think this is more related to possibly reflux or an ulcer.  I am sending a medication called pantoprazole which she should take once a day for 6 weeks.  Please return for follow-up if your symptoms worsen, or do not improve.  Please seek immediate medical attention if you develop chest pain, shortness of breath, recurrent nausea/vomiting.  You are still prediabetic.  We would encourage 150 minutes of moderate intensity activity weekly, and a diet rich in fruits and vegetables.  This can help to prevent progression to diabetes.  You are also due for a colonoscopy for colon cancer screening.  I have sent a referral to GI, their office will contact you.  Thank you for coming to your visit as scheduled. We have had a large "no-show" problem lately, and this significantly limits our ability to see and care for patients. As a friendly reminder- if you cannot make your appointment please call to cancel. We do have a no show policy for those who do not cancel within 24 hours. Our policy is that if you miss or fail to cancel an appointment within 24 hours, 3 times in a 37-month period, you may be dismissed from our clinic.   Thank you for choosing [facility].   Please call [phone] with any questions about today's appointment.  Please be sure to schedule follow up at the front  desk before you leave today.   Sabino Dick, DO PGY-3 Family Medicine

## 2022-10-09 ENCOUNTER — Telehealth: Payer: Self-pay

## 2022-10-09 NOTE — Telephone Encounter (Signed)
Patient LVM on nurse line requesting yellow fever vaccination.   Attempted to return call to patient. He did not answer, LVM asking that he call back to office.   When patient calls back, please advise that we do not have this vaccination and he can receive this at the health department.   Veronda Prude, RN

## 2023-02-18 ENCOUNTER — Ambulatory Visit (INDEPENDENT_AMBULATORY_CARE_PROVIDER_SITE_OTHER): Payer: Commercial Managed Care - HMO | Admitting: Family Medicine

## 2023-02-18 VITALS — BP 105/75 | HR 59 | Ht 64.0 in | Wt 162.0 lb

## 2023-02-18 DIAGNOSIS — G8929 Other chronic pain: Secondary | ICD-10-CM

## 2023-02-18 DIAGNOSIS — M545 Low back pain, unspecified: Secondary | ICD-10-CM | POA: Diagnosis not present

## 2023-02-18 DIAGNOSIS — B360 Pityriasis versicolor: Secondary | ICD-10-CM

## 2023-02-18 MED ORDER — NAPROXEN 500 MG PO TABS: 500.0000 mg | ORAL_TABLET | Freq: Two times a day (BID) | ORAL | 0 refills | Status: DC

## 2023-02-18 NOTE — Progress Notes (Cosign Needed Addendum)
    SUBJECTIVE:   CHIEF COMPLAINT / HPI:   Back/right leg pain Started Tuesday, sudden onset while sitting at work. Radiates down R leg. Ibuprofen did not help.  Happened previously, resolved spontaneously.  Denies traumatic injury or prior back surgery.  Worse with movement.  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  Utilized Lamisil previously but it burned.  Finished course of nystatin ointment with mild improvement in symptoms but states that has recurred.  Very itchy, nonpainful.  PERTINENT  PMH / PSH: Prediabetes  OBJECTIVE:   BP 105/75   Pulse (!) 59   Ht 5\' 4"  (1.626 m)   Wt 162 lb (73.5 kg)   SpO2 100%   BMI 27.81 kg/m    General: NAD, pleasant, able to participate in exam Cardiac: RRR, no murmurs. Respiratory: CTAB, normal effort, No wheezes, rales or rhonchi Extremities: no edema or cyanosis. Skin: Warm and dry.  Hyperpigmented, thickened skin circumferentially around neck.  Some satellite lesions noted. MSK: Nontender to palpation over lumbar spinous process and paraspinal musculature.  Some pain in extension and right sidebending.  5/5 hip flexion, knee extension and plantarflexion bilaterally.  Sensation intact globally.  2+ dorsalis pedis and posterior tibialis pulses.  No skin changes over affected area noted. Neuro: alert, no obvious focal deficits Psych: Normal affect and mood  ASSESSMENT/PLAN:   Assessment & Plan Chronic right-sided low back pain, unspecified whether sciatica present Acute on chronic back pain as per chart review this has happened previously. Sudden onset most likely muscle spasm versus nerve impingement given radiation down right leg.  No red flag symptoms upon exam.  Patient requested imaging but discussed therapy would be most beneficial and if continues to have symptoms after 4 to 6 weeks would consider imaging. -Referral to physical therapy -Naproxen 500 mg twice daily and Tylenol for  breakthrough pain in addition to supportive care with heat and stretching. Tinea versicolor Recurrence of symptoms, previously failed treatment with Lamisil and nystatin ointment.  Discussed likely recurrent nature of this infection and need for regular maintenance with antifungal therapy -Recommend utilizing Selsun Blue topically 15 minutes prior to showering daily for the next week in affected areas.  Then utilize 2-3 times per week for maintenance     Dr. Elberta Fortis, DO [facility] Medicine Center

## 2023-02-18 NOTE — Patient Instructions (Signed)
It was wonderful to see you today! Thank you for choosing [facility].   Please bring ALL of your medications with you to every visit.   Today we talked about:  I am referring you to physical therapy for your back, you will hear from our clinic about this referral.  I would recommend using the naproxen twice daily for pain and you can use Tylenol for breakthrough pain.  You can use [phone] mg of Tylenol up to 3 times per day.  I also think a heating pad/warm shower could help with your pain. For the rash on your neck and groin I would recommend using Selsun Blue shampoo as this is a recurrent infection.  I would apply it and leave it for 15 minutes every day for a week.  You will likely need maintenance meaning using this 2-3 times per week to keep the infection at bay.  Please follow up as needed for persistent symptoms  If you haven't already, sign up for My Chart to have easy access to your labs results, and communication with your primary care physician.  Call the clinic at [phone] if your symptoms worsen or you have any concerns.  Please be sure to schedule follow up at the front desk before you leave today.   Elberta Fortis, DO Family Medicine

## 2023-03-04 ENCOUNTER — Ambulatory Visit: Payer: Commercial Managed Care - HMO | Admitting: Family Medicine

## 2023-03-04 NOTE — Progress Notes (Unsigned)
    SUBJECTIVE:   CHIEF COMPLAINT / HPI: back pain follow-up  ***  PERTINENT  PMH / PSH: Chronic right low back pain  OBJECTIVE:   There were no vitals taken for this visit.  ***  ASSESSMENT/PLAN:   Assessment & Plan  No follow-ups on file.  Celine Mans, MD [facility]

## 2023-04-01 ENCOUNTER — Ambulatory Visit: Payer: Commercial Managed Care - HMO | Admitting: Family Medicine

## 2023-04-01 NOTE — Progress Notes (Deleted)
    SUBJECTIVE:   CHIEF COMPLAINT / HPI:   Ears hurting ***  PERTINENT  PMH / PSH: ***  OBJECTIVE:   There were no vitals taken for this visit.  ***  ASSESSMENT/PLAN:   No problem-specific Assessment & Plan notes found for this encounter.     Lauraine Norse, DO [facility] Center

## 2023-11-26 ENCOUNTER — Other Ambulatory Visit: Payer: Self-pay | Admitting: Student

## 2023-11-26 ENCOUNTER — Encounter: Payer: Self-pay | Admitting: Student

## 2023-11-26 ENCOUNTER — Ambulatory Visit (INDEPENDENT_AMBULATORY_CARE_PROVIDER_SITE_OTHER): Admitting: Student

## 2023-11-26 VITALS — BP 111/67 | HR 85 | Ht 66.0 in | Wt 164.8 lb

## 2023-11-26 DIAGNOSIS — M542 Cervicalgia: Secondary | ICD-10-CM | POA: Diagnosis not present

## 2023-11-26 DIAGNOSIS — R1013 Epigastric pain: Secondary | ICD-10-CM | POA: Diagnosis not present

## 2023-11-26 DIAGNOSIS — L853 Xerosis cutis: Secondary | ICD-10-CM | POA: Diagnosis not present

## 2023-11-26 DIAGNOSIS — G8929 Other chronic pain: Secondary | ICD-10-CM | POA: Diagnosis not present

## 2023-11-26 DIAGNOSIS — B354 Tinea corporis: Secondary | ICD-10-CM | POA: Diagnosis present

## 2023-11-26 MED ORDER — CLOTRIMAZOLE-BETAMETHASONE 1-0.05 % EX LOTN: TOPICAL_LOTION | Freq: Two times a day (BID) | CUTANEOUS | 0 refills | Status: AC

## 2023-11-26 MED ORDER — PANTOPRAZOLE SODIUM 40 MG PO TBEC: 40.0000 mg | DELAYED_RELEASE_TABLET | Freq: Every day | ORAL | 0 refills | Status: AC

## 2023-11-26 MED ORDER — NYSTATIN-TRIAMCINOLONE 100000-0.1 UNIT/GM-% EX OINT: 1.0000 | TOPICAL_OINTMENT | Freq: Two times a day (BID) | CUTANEOUS | 0 refills | Status: DC

## 2023-11-26 NOTE — Addendum Note (Signed)
 Addended by: CLEOTILDE PERKINS on: 11/26/2023 04:01 PM   Modules accepted: Orders

## 2023-11-26 NOTE — Patient Instructions (Addendum)
 It was great to see you today!   I have prescribed an antifungal cream that you can use twice daily for the rash on your groin.   Use Vaseline or Aquaphor on your feet to help the dry skin.   For your neck pain trial the neck exercises every day for 6 weeks. You can take ibuprofen  as needed for severe pain. Do not exceed more than 3 doses per week to avoid upsetting your stomach.   For your stomach pain try the acid reducer medication. If you do not have improvement please schedule another appointment for further workup.   Please call the clinic at [phone] if your symptoms worsen or you have any concerns.  Thank you for allowing me to participate in your care, Dr. Damien Pinal [facility] Family Medicine

## 2023-11-26 NOTE — Progress Notes (Addendum)
    SUBJECTIVE:   CHIEF COMPLAINT / HPI:   Steven Campos is a 48 y.o. male presenting for right sided pain and rash.   Right side pain neck down his back. Got worse yesterday. Neck pain comes and goes but pain in lower right side started yesterday.  Feels like cramping worse with deep inspiration, sharp pain on RUQ of stomach; does not hurt worse when he eats but improves.  Does not radiate  No N/V/D No hematuria  No history of kidney stones  Takes ibuprofen  once per month  Does not drink alcohol  No smoking   Rash: on groin present for several months. Comes and goes. Feels like it has spread to his upper back. Itchy. No family members with rash.  No creams tried at home Previously had positive KOH skin in 06/2022   PERTINENT  PMH / PSH: reviewed and updated.  OBJECTIVE:   BP 111/67   Pulse 85   Ht 5' 6 (1.676 m)   Wt 164 lb 12.8 oz (74.8 kg)   SpO2 100%   BMI 26.60 kg/m   Well-appearing, no acute distress Cardio: Regular rate, regular rhythm, no murmurs on exam. Pulm: Clear, no wheezing, no crackles. No increased work of breathing Abdominal: bowel sounds present, soft, non-tender, non-distended Extremities: no peripheral edema  Skin: MA chaperone present  Darker area of skin well circumscribed in bilateral inguinal areas. No signs of secondary infections with mild excoriations. No open lesions or drainage.   ASSESSMENT/PLAN:   Assessment & Plan Tinea corporis Previously diagnosed but never treated.  Prescribed Clotrimazole  ointment  Patient to follow up if no improvement  Can consider oral diflucan  Epigastric pain Do not believe pain is 2/2 to acute intraabdominal process with well appearance and benign abdominal exam  Chronic neck pain Normal neurological exam  Start with conservative management with daily exercise for 6 weeks  NSAIDs PRN, cautioned on GI side effects.  Dry skin Discussed applying Vaseline onto bottom of feet daily  If no improvement  consider referral to Derm clinic      Damien Pinal, DO [facility] Center

## 2023-11-30 NOTE — Telephone Encounter (Signed)
 A user error has taken place: encounter opened in error, closed for administrative reasons.

## 2023-12-08 NOTE — Telephone Encounter (Signed)
 A user error has taken place: encounter opened in error, closed for administrative reasons.

## 2024-01-05 IMAGING — CR DG LUMBAR SPINE COMPLETE 4+V
5 series · 5 of 5 positions shown · non-contrast
Comparison: X-ray 12/19/2013.

CLINICAL DATA: Acute bilateral low back pain.

EXAM:
LUMBAR SPINE - COMPLETE 4+ VIEW

[t l-spine a.p.]
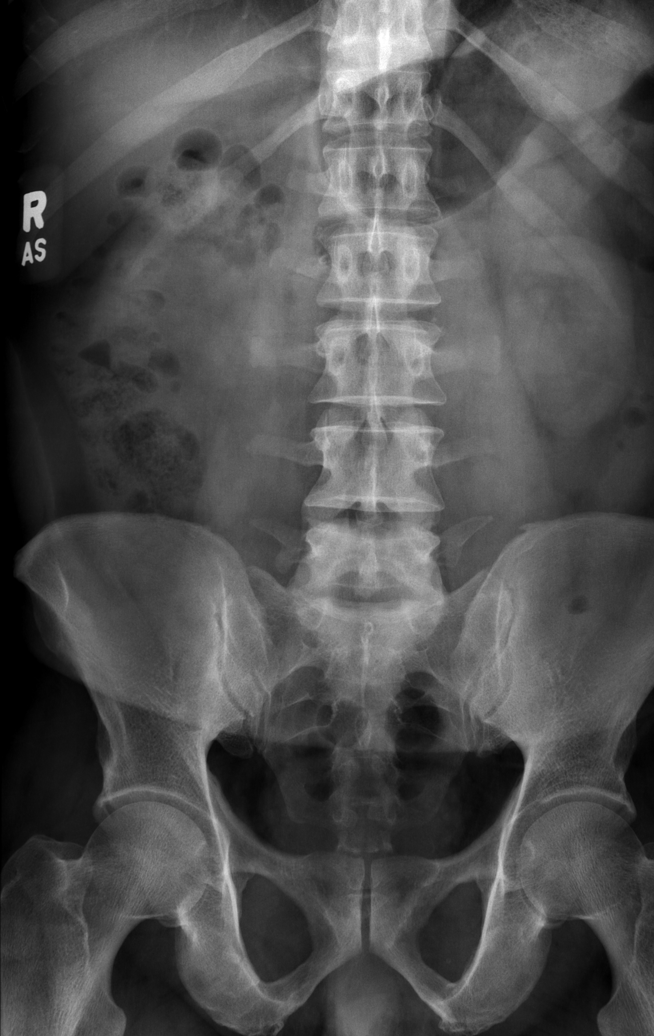

[t l-spine oblique exposure (1 of 2)]
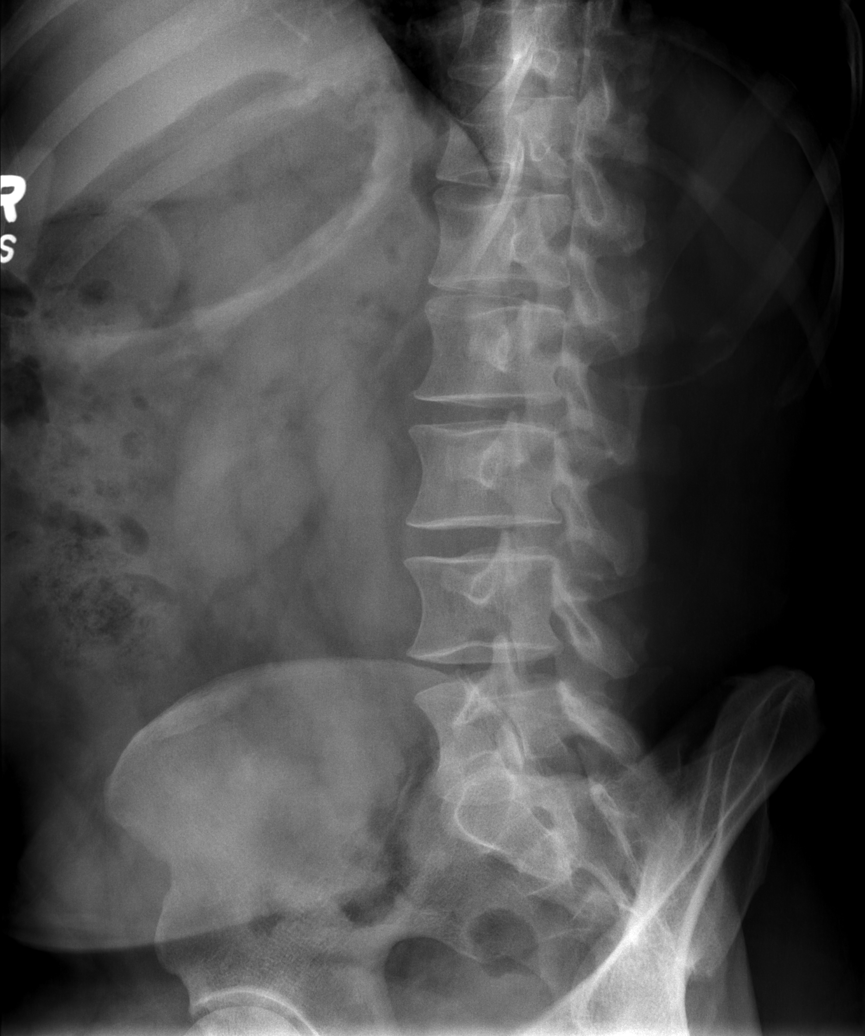

[t l-spine oblique exposure (2 of 2)]
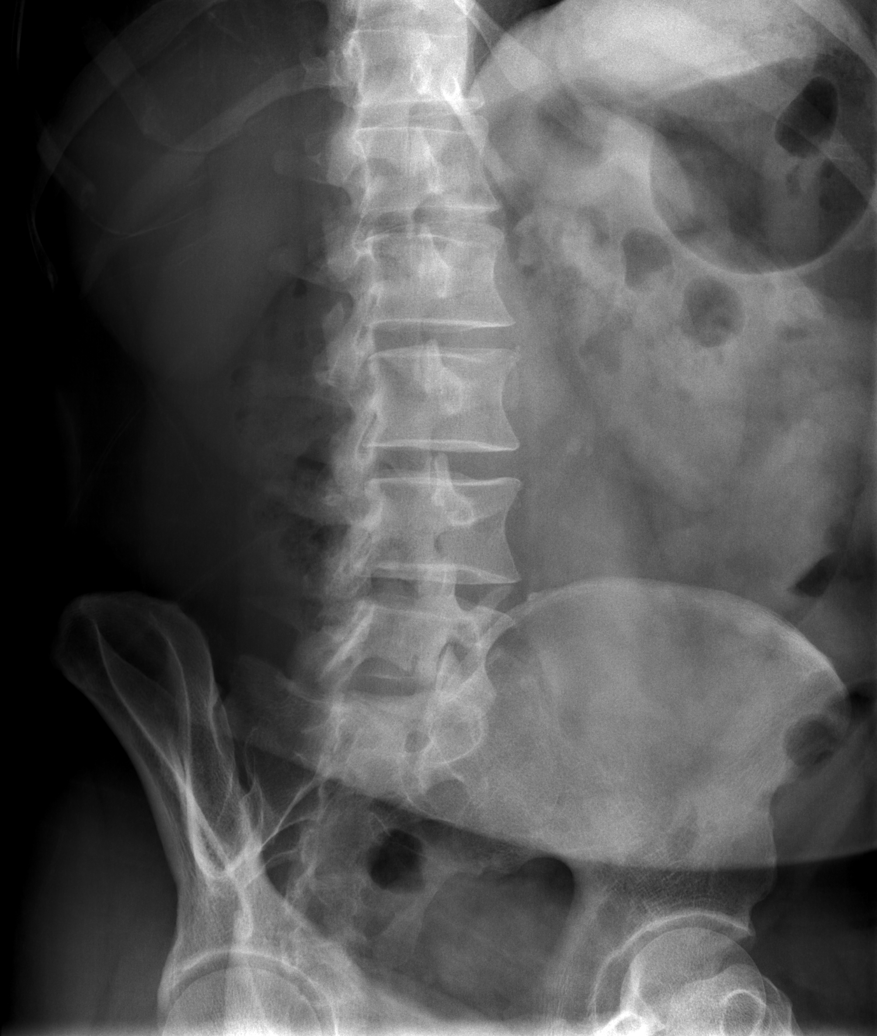

[t l-spine lat]
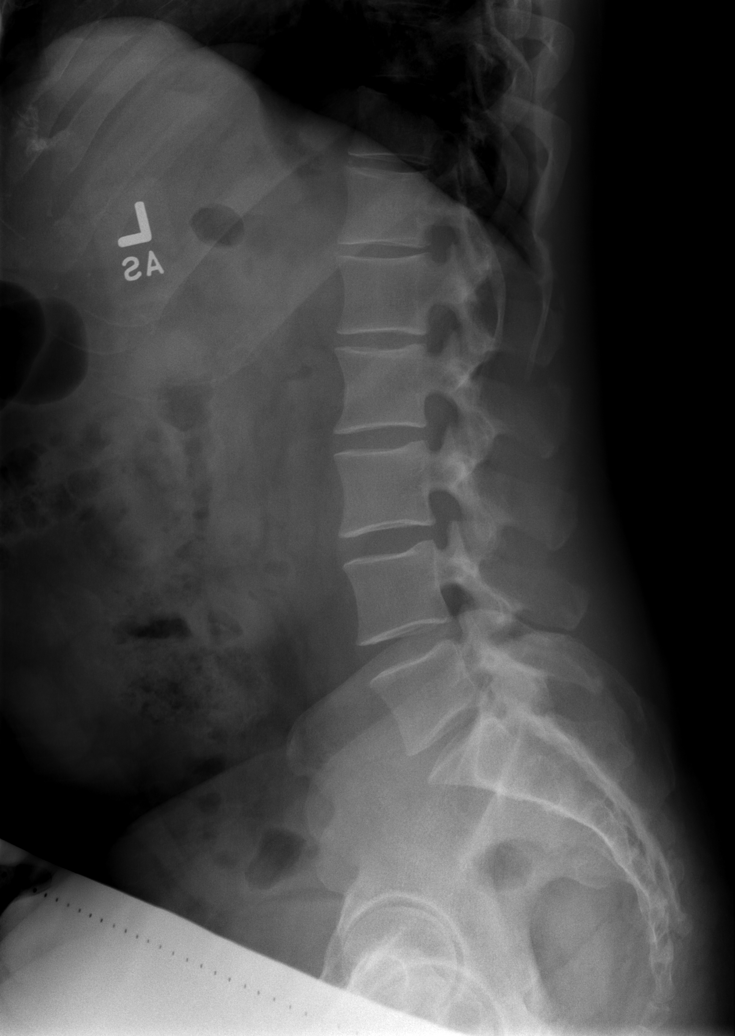

[t l-spine l5-s1 spot]
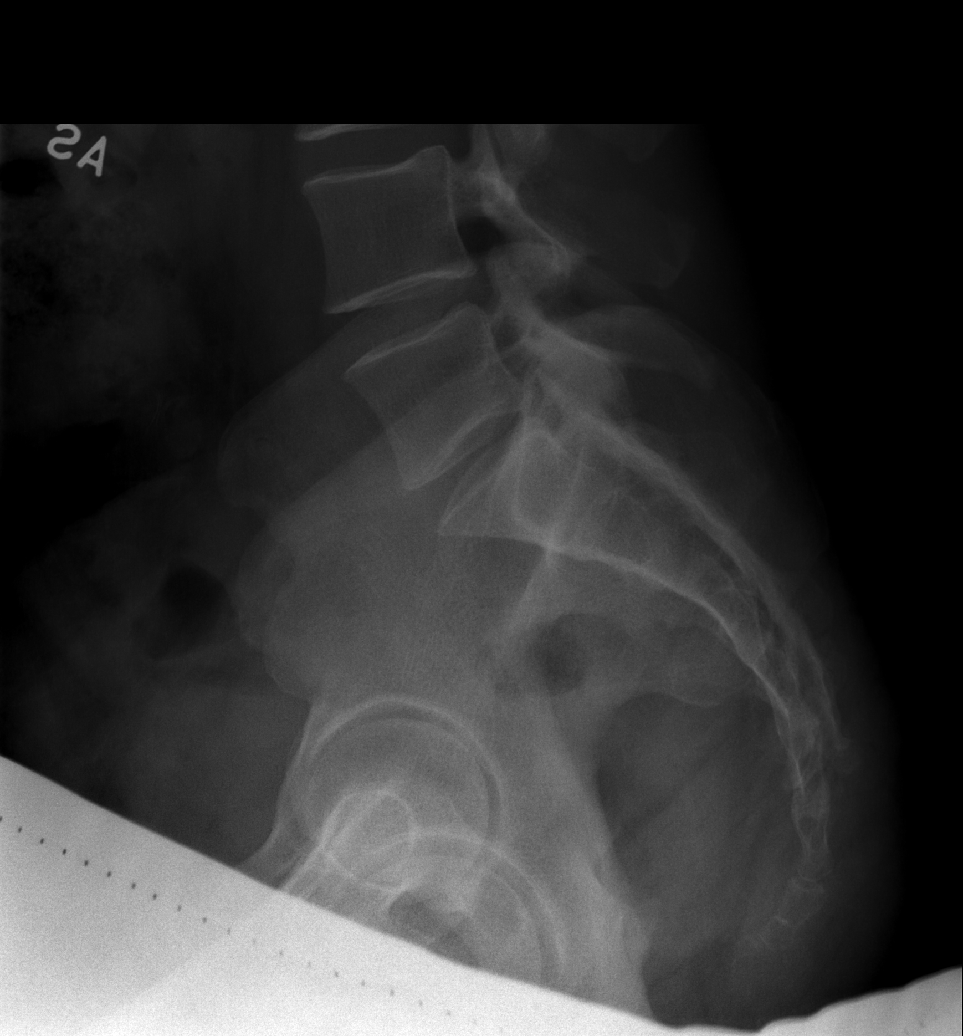

[5 of 5 positions shown; findings below may reference images not displayed]

FINDINGS: There is no evidence of lumbar spine fracture. Alignment is normal.
Minimal narrowing of the L5-S1 interspace is noted unchanged.
IMPRESSION: Stable minimal degenerative joint changes at L5-S1.

## 2024-03-10 ENCOUNTER — Ambulatory Visit: Admitting: Family Medicine

## 2024-03-10 ENCOUNTER — Encounter: Payer: Self-pay | Admitting: Family Medicine

## 2024-03-10 VITALS — BP 113/73 | HR 70 | Ht 66.0 in | Wt 167.1 lb

## 2024-03-10 DIAGNOSIS — Z13228 Encounter for screening for other metabolic disorders: Secondary | ICD-10-CM

## 2024-03-10 DIAGNOSIS — Z0000 Encounter for general adult medical examination without abnormal findings: Secondary | ICD-10-CM

## 2024-03-10 DIAGNOSIS — L853 Xerosis cutis: Secondary | ICD-10-CM

## 2024-03-10 DIAGNOSIS — Z125 Encounter for screening for malignant neoplasm of prostate: Secondary | ICD-10-CM

## 2024-03-10 DIAGNOSIS — R7303 Prediabetes: Secondary | ICD-10-CM

## 2024-03-10 DIAGNOSIS — E785 Hyperlipidemia, unspecified: Secondary | ICD-10-CM | POA: Insufficient documentation

## 2024-03-10 DIAGNOSIS — E782 Mixed hyperlipidemia: Secondary | ICD-10-CM

## 2024-03-10 NOTE — Assessment & Plan Note (Signed)
 Repeat A1c today. Repeat lipid panel today. ASCVD risk 4%.  Would not start statin with mildly elevated LDL.

## 2024-03-10 NOTE — Patient Instructions (Addendum)
 It was great to see you! Thank you for allowing me to participate in your care!  Our plans for today:   VISIT SUMMARY: Today, you came in for your annual physical exam. We discussed several health concerns including itching on your neck, sexual health, fatigue, and preventive health maintenance.  YOUR PLAN: PREDIABETES: Your A1c has not been checked in over a year, but you have no symptoms or concerns. -We have ordered an A1c test to check your blood sugar levels.  HYPERLIPIDEMIA: Your LDL cholesterol was high three years ago, but your risk of heart disease is low. -We have ordered a cholesterol test to check your current levels.  DRY SKIN WITH PRURITUS: You have intermittent itching on your neck and currently use cocoa butter. -We recommend using an eczema cream, Vaseline, Aquaphor, or Eucerin daily or twice daily to help with the itching.  GENERAL HEALTH MAINTENANCE: We discussed the importance of a colonoscopy for cancer screening, which is recommended starting at age 63. -We have ordered a colonoscopy for you. -We discussed using the MyChart app to access your healthcare information.  We are checking a PSA, this is a screening test for prostate cancer. I will let you know the results.  Please arrive 15 minutes PRIOR to your next scheduled appointment time! If you do not, this affects OTHER patients' care.  Take care and seek immediate care sooner if you develop any concerns.   Ozell Provencal, MD, PGY-3 [facility] Family Medicine 4:13 PM 03/10/2024  [facility]

## 2024-03-10 NOTE — Progress Notes (Signed)
° ° °  SUBJECTIVE:   Chief compliant/HPI: annual examination  Steven Campos is a 48 y.o. who presents today for an annual exam.   Discussed the use of AI scribe software for clinical note transcription with the patient, who gave verbal consent to proceed.  History of Present Illness Steven Campos is a 48 year old male who presents for an annual physical exam.  Pruritus - Intermittent pruritus localized to the neck - Frequently scratches affected area - Previously evaluated in clinic multiple times for this symptom - Did not obtain prescribed topical cream - Currently uses cocoa butter for symptom management - Uncertain if he should change topical products  Sexual health concerns - Sexually active with wife - No changes in sexual partners or activity  Fatigue - Experiences fatigue - No further details provided regarding onset, duration, or associated symptoms  Hyperlipidemia - Last cholesterol check three years ago showed elevated LDL - No current lipid-lowering therapy  Preventive health maintenance - No history of colonoscopy    History tabs reviewed and updated.   OBJECTIVE:   BP 113/73   Pulse 70   Ht 5' 6 (1.676 m)   Wt 167 lb 2 oz (75.8 kg)   SpO2 97%   BMI 26.97 kg/m   General: A&O, NAD HEENT: No sign of trauma, EOM grossly intact, moist mucous membranes, xerosis and mild hyperkeratosis around the neck crease Cardiac: RRR, no m/r/g Respiratory: CTAB, normal WOB, no w/c/r GI: Soft, NTTP, non-distended, no rebound or guarding Extremities: NTTP, no peripheral edema. Neuro: Moves all four extremities appropriately. Psych: Appropriate mood and affect     ASSESSMENT/PLAN:   Assessment & Plan Annual physical exam Annual Examination  See AVS for recommendations.  PHQ score 0, reviewed.  Blood pressure value is at goal.   Considered the following screening exams based upon USPSTF recommendations: Diabetes screening: ordered HIV  testing:discussed and declined Hepatitis C: discussed and declined Hepatitis B:discussed and declined Syphilis if at high risk: discussed and declined GC/CT not at high risk and not ordered. Lipid panel (nonfasting or fasting) discussed based upon AHA recommendations and ordered.  Consider repeat every 4-6 years.  Reviewed risk factors for latent tuberculosis and not indicated.  Cancer Screening Discussion  Colorectal cancer screening: discussed and declined today.  He will reach out to request what screening he would like. Vaccinations up-to-date.   Follow up in 1 year as needed. MyChart Activation: texted sign up link to phone Prediabetes Screening for metabolic disorder Moderate mixed hyperlipidemia not requiring statin therapy Repeat A1c today. Repeat lipid panel today. ASCVD risk 4%.  Would not start statin with mildly elevated LDL. Screening PSA (prostate specific antigen) Discussed with patient, PSA ordered. Xerosis of skin Recommended to apply emollient to neck BID as needed for pruritus. Listed options in AVS.      Steven Provencal, MD [facility]

## 2024-03-11 LAB — LIPID PANEL
Chol/HDL Ratio: 5.7 ratio — ABNORMAL HIGH (ref 0.0–5.0)
Cholesterol, Total: 216 mg/dL — ABNORMAL HIGH (ref 100–199)
HDL: 38 mg/dL — ABNORMAL LOW (ref >39)
LDL Chol Calc (NIH): 132 mg/dL — ABNORMAL HIGH (ref 0–99)
Triglycerides: 256 mg/dL — ABNORMAL HIGH (ref 0–149)
VLDL Cholesterol Cal: 46 mg/dL — ABNORMAL HIGH (ref 5–40)

## 2024-03-11 LAB — HEMOGLOBIN A1C
Est. average glucose Bld gHb Est-mCnc: 140 mg/dL
Hgb A1c MFr Bld: 6.5 % — ABNORMAL HIGH (ref 4.8–5.6)

## 2024-03-11 LAB — PSA: Prostate Specific Ag, Serum: 0.9 ng/mL (ref 0.0–4.0)

## 2024-03-15 ENCOUNTER — Ambulatory Visit: Payer: Self-pay | Admitting: Family Medicine

## 2024-03-20 NOTE — Telephone Encounter (Signed)
 Call to discuss lab results.  A1c elevated at 6.5 previously 5.7. Unfortunately within range of true diabetes.  Will confirm in 3 months with repeat A1c.  In the meantime counseled on dietary changes for reducing sugar.  Cholesterol total elevated 216, LDL 132.  Discussed further dietary changes and lifestyle modification as above as well.  Repeat in 3 months as well.  Normal PSA.
